# Patient Record
Sex: Female | Born: 1970 | Race: Black or African American | Hispanic: No | Marital: Single | Smoking: Never smoker
Health system: Southern US, Community
[De-identification: ages and names within clinical notes are randomized; demographics above are authoritative.]

## PROBLEM LIST (undated history)

## (undated) DIAGNOSIS — F419 Anxiety disorder, unspecified: Secondary | ICD-10-CM

## (undated) DIAGNOSIS — I10 Essential (primary) hypertension: Secondary | ICD-10-CM

## (undated) DIAGNOSIS — J45909 Unspecified asthma, uncomplicated: Secondary | ICD-10-CM

---

## 2000-11-08 ENCOUNTER — Encounter: Payer: Self-pay | Admitting: Emergency Medicine

## 2000-11-08 ENCOUNTER — Emergency Department (HOSPITAL_COMMUNITY): Admission: EM | Admit: 2000-11-08 | Discharge: 2000-11-09 | Payer: Self-pay | Admitting: Emergency Medicine

## 2002-08-28 ENCOUNTER — Emergency Department (HOSPITAL_COMMUNITY): Admission: EM | Admit: 2002-08-28 | Discharge: 2002-08-28 | Payer: Self-pay | Admitting: Emergency Medicine

## 2002-08-29 ENCOUNTER — Encounter: Payer: Self-pay | Admitting: Emergency Medicine

## 2002-10-03 ENCOUNTER — Encounter: Payer: Self-pay | Admitting: Emergency Medicine

## 2002-10-03 ENCOUNTER — Emergency Department (HOSPITAL_COMMUNITY): Admission: EM | Admit: 2002-10-03 | Discharge: 2002-10-03 | Payer: Self-pay | Admitting: Emergency Medicine

## 2003-03-16 ENCOUNTER — Emergency Department (HOSPITAL_COMMUNITY): Admission: EM | Admit: 2003-03-16 | Discharge: 2003-03-16 | Payer: Self-pay | Admitting: Emergency Medicine

## 2003-04-12 ENCOUNTER — Emergency Department (HOSPITAL_COMMUNITY): Admission: EM | Admit: 2003-04-12 | Discharge: 2003-04-12 | Payer: Self-pay | Admitting: Emergency Medicine

## 2003-07-21 ENCOUNTER — Emergency Department (HOSPITAL_COMMUNITY): Admission: EM | Admit: 2003-07-21 | Discharge: 2003-07-21 | Payer: Self-pay | Admitting: Emergency Medicine

## 2004-05-03 ENCOUNTER — Inpatient Hospital Stay (HOSPITAL_COMMUNITY): Admission: AD | Admit: 2004-05-03 | Discharge: 2004-05-03 | Payer: Self-pay | Admitting: Obstetrics

## 2005-12-29 ENCOUNTER — Emergency Department (HOSPITAL_COMMUNITY): Admission: EM | Admit: 2005-12-29 | Discharge: 2005-12-29 | Payer: Self-pay | Admitting: Emergency Medicine

## 2006-01-27 ENCOUNTER — Emergency Department (HOSPITAL_COMMUNITY): Admission: EM | Admit: 2006-01-27 | Discharge: 2006-01-27 | Payer: Self-pay | Admitting: Emergency Medicine

## 2007-04-14 ENCOUNTER — Emergency Department (HOSPITAL_COMMUNITY): Admission: EM | Admit: 2007-04-14 | Discharge: 2007-04-15 | Payer: Self-pay | Admitting: Emergency Medicine

## 2007-09-17 ENCOUNTER — Emergency Department (HOSPITAL_COMMUNITY): Admission: EM | Admit: 2007-09-17 | Discharge: 2007-09-17 | Payer: Self-pay | Admitting: Emergency Medicine

## 2009-10-20 ENCOUNTER — Emergency Department (HOSPITAL_COMMUNITY): Admission: EM | Admit: 2009-10-20 | Discharge: 2009-10-20 | Payer: Self-pay | Admitting: Emergency Medicine

## 2010-12-27 LAB — URINALYSIS, ROUTINE W REFLEX MICROSCOPIC
Glucose, UA: NEGATIVE
Ketones, ur: NEGATIVE
pH: 6

## 2010-12-27 LAB — URINE MICROSCOPIC-ADD ON

## 2010-12-27 LAB — URINE CULTURE

## 2011-01-24 ENCOUNTER — Emergency Department (HOSPITAL_COMMUNITY): Payer: No Typology Code available for payment source

## 2011-01-24 ENCOUNTER — Emergency Department (HOSPITAL_COMMUNITY)
Admission: EM | Admit: 2011-01-24 | Discharge: 2011-01-24 | Disposition: A | Discharge status: Home / Self Care | Payer: No Typology Code available for payment source | Attending: Emergency Medicine | Admitting: Emergency Medicine

## 2011-01-24 DIAGNOSIS — R51 Headache: Secondary | ICD-10-CM | POA: Insufficient documentation

## 2011-01-24 DIAGNOSIS — M542 Cervicalgia: Secondary | ICD-10-CM | POA: Insufficient documentation

## 2012-04-28 ENCOUNTER — Emergency Department (HOSPITAL_COMMUNITY)
Admission: EM | Admit: 2012-04-28 | Discharge: 2012-04-28 | Disposition: A | Discharge status: Home / Self Care | Payer: Self-pay | Attending: Emergency Medicine | Admitting: Emergency Medicine

## 2012-04-28 ENCOUNTER — Encounter (HOSPITAL_COMMUNITY): Payer: Self-pay | Admitting: *Deleted

## 2012-04-28 DIAGNOSIS — M79609 Pain in unspecified limb: Secondary | ICD-10-CM | POA: Insufficient documentation

## 2012-04-28 DIAGNOSIS — R6883 Chills (without fever): Secondary | ICD-10-CM | POA: Insufficient documentation

## 2012-04-28 DIAGNOSIS — M545 Low back pain, unspecified: Secondary | ICD-10-CM | POA: Insufficient documentation

## 2012-04-28 DIAGNOSIS — G8929 Other chronic pain: Secondary | ICD-10-CM | POA: Insufficient documentation

## 2012-04-28 DIAGNOSIS — M722 Plantar fascial fibromatosis: Secondary | ICD-10-CM | POA: Insufficient documentation

## 2012-04-28 DIAGNOSIS — M549 Dorsalgia, unspecified: Secondary | ICD-10-CM

## 2012-04-28 MED ORDER — METHOCARBAMOL 500 MG PO TABS: 500.0000 mg | ORAL_TABLET | Freq: Two times a day (BID) | ORAL | Status: DC

## 2012-04-28 MED ORDER — NAPROXEN 500 MG PO TABS: 500.0000 mg | ORAL_TABLET | Freq: Two times a day (BID) | ORAL | Status: DC
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
## 2012-04-28 NOTE — ED Provider Notes (Signed)
Medical screening examination/treatment/procedure(s) were performed by non-physician practitioner and as supervising physician I was immediately available for consultation/collaboration.   Avelardo Reesman, MD 04/28/12 2300 

## 2012-05-02 ENCOUNTER — Emergency Department (HOSPITAL_COMMUNITY)
Admission: EM | Admit: 2012-05-02 | Discharge: 2012-05-02 | Disposition: A | Discharge status: Home / Self Care | Payer: Self-pay | Attending: Emergency Medicine | Admitting: Emergency Medicine

## 2012-05-02 ENCOUNTER — Encounter (HOSPITAL_COMMUNITY): Payer: Self-pay | Admitting: *Deleted

## 2012-05-02 DIAGNOSIS — R110 Nausea: Secondary | ICD-10-CM | POA: Insufficient documentation

## 2012-05-02 DIAGNOSIS — G43909 Migraine, unspecified, not intractable, without status migrainosus: Secondary | ICD-10-CM | POA: Insufficient documentation

## 2012-05-02 DIAGNOSIS — Z79899 Other long term (current) drug therapy: Secondary | ICD-10-CM | POA: Insufficient documentation

## 2012-05-02 MED ORDER — HYDROCODONE-ACETAMINOPHEN 5-325 MG PO TABS: 1.0000 | ORAL_TABLET | ORAL | Status: DC | PRN

## 2012-05-02 MED ORDER — MORPHINE SULFATE 4 MG/ML IJ SOLN
6.0000 mg | Freq: Once | INTRAMUSCULAR | Status: AC
  Administered 2012-05-02: 6 mg via INTRAVENOUS
  Filled 2012-05-02: qty 2

## 2012-05-02 MED ORDER — METOCLOPRAMIDE HCL 5 MG/ML IJ SOLN
10.0000 mg | Freq: Once | INTRAMUSCULAR | Status: AC
  Administered 2012-05-02: 10 mg via INTRAVENOUS
  Filled 2012-05-02: qty 2

## 2012-05-02 MED ORDER — ONDANSETRON 8 MG PO TBDP: 8.0000 mg | ORAL_TABLET | Freq: Three times a day (TID) | ORAL | Status: DC | PRN

## 2012-05-02 MED ORDER — KETOROLAC TROMETHAMINE 30 MG/ML IJ SOLN
30.0000 mg | Freq: Once | INTRAMUSCULAR | Status: AC
  Administered 2012-05-02: 30 mg via INTRAVENOUS
  Filled 2012-05-02: qty 1

## 2012-05-02 MED ORDER — NAPROXEN 500 MG PO TABS: 500.0000 mg | ORAL_TABLET | Freq: Two times a day (BID) | ORAL | Status: DC

## 2012-05-02 NOTE — ED Notes (Signed)
Pt reports nausea and migraine x several days.  Reports intermittent pain.  Reports taking tylenol and ibuprofen with no relief.  Denies vomiting.  Reports last migraine was 3 months ago.

## 2012-05-02 NOTE — ED Provider Notes (Signed)
History     CSN: [id number]  Arrival date & time 05/02/12  1218   First MD Initiated Contact with Patient 05/02/12 1232      Chief Complaint  Patient presents with  . Migraine     The history is provided by the patient.   the patient reports worsening frontal headache over the past several days.  Her pain is been constant but at times gets worse.  She's tried ibuprofen and Tylenol without improvement in her symptoms.  No vomiting.  Only nausea.  She does have a history of migraine headaches and states this feels similar.  No use of anticoagulants.  No recent falls or trauma.  Her symptoms are moderate to severe in severity.  Her pain is worsened by noise and bright lights.  Nothing improves her symptoms.  She denies neck pain.  No fevers or chills.  No chest pain shortness of breath.  No abdominal pain.  Her headache was not acute in onset  Past medical history 1.  migraine headache  History reviewed. No pertinent past surgical history.  History reviewed. No pertinent family history.  History  Substance Use Topics  . Smoking status: Never Smoker   . Smokeless tobacco: Not on file  . Alcohol Use: No    OB History    Grav Para Term Preterm Abortions TAB SAB Ect Mult Living                  Review of Systems  All other systems reviewed and are negative.    Allergies  Review of patient's allergies indicates no known allergies.  Home Medications   Current Outpatient Rx  Name  Route  Sig  Dispense  Refill  . ACETAMINOPHEN 500 MG PO TABS   Oral   Take 1,000 mg by mouth every 6 (six) hours as needed. FOR PAIN         . ADULT MULTIVITAMIN W/MINERALS CH   Oral   Take 1 tablet by mouth daily.         Marland Kitchen HYDROCODONE-ACETAMINOPHEN 5-325 MG PO TABS   Oral   Take 1 tablet by mouth every 4 (four) hours as needed for pain.   15 tablet   0   . NAPROXEN 500 MG PO TABS   Oral   Take 1 tablet (500 mg total) by mouth 2 (two) times daily.   30 tablet   0     BP  144/93  Pulse 93  Temp 98.3 F (36.8 C) (Oral)  Resp 16  SpO2 100%  Physical Exam  Nursing note and vitals reviewed. Constitutional: She is oriented to person, place, and time. She appears well-developed and well-nourished. No distress.  HENT:  Head: Normocephalic and atraumatic.  Eyes: EOM are normal. Pupils are equal, round, and reactive to light.  Neck: Normal range of motion.  Cardiovascular: Normal rate, regular rhythm and normal heart sounds.   Pulmonary/Chest: Effort normal and breath sounds normal.  Abdominal: Soft. She exhibits no distension. There is no tenderness.  Musculoskeletal: Normal range of motion.  Neurological: She is alert and oriented to person, place, and time.       5/5 strength in major muscle groups of  bilateral upper and lower extremities. Speech normal. No facial asymetry.   Skin: Skin is warm and dry.  Psychiatric: She has a normal mood and affect. Judgment normal.    ED Course  Procedures (including critical care time)  Labs Reviewed - No data to display No results  found.   1. Migraine       MDM  Typical migraine headache for the pt. Non focal neuro exam. No recent head trauma. No fever. Doubt meningitis. Doubt intracranial bleed. Doubt normal pressure hydrocephalus. No indication for imaging. Will treat with migraine cocktail and reevaluate  2:11 PM Patient is feeling better at this time.  Discharge home in good condition        Lyanne Co, MD 05/02/12 [phone]

## 2013-03-21 ENCOUNTER — Encounter (HOSPITAL_COMMUNITY): Payer: Self-pay | Admitting: Emergency Medicine

## 2013-03-21 ENCOUNTER — Emergency Department (HOSPITAL_COMMUNITY)
Admission: EM | Admit: 2013-03-21 | Discharge: 2013-03-21 | Disposition: A | Discharge status: Home / Self Care | Payer: Medicaid Other | Attending: Emergency Medicine | Admitting: Emergency Medicine

## 2013-03-21 DIAGNOSIS — H6982 Other specified disorders of Eustachian tube, left ear: Secondary | ICD-10-CM

## 2013-03-21 DIAGNOSIS — H6980 Other specified disorders of Eustachian tube, unspecified ear: Secondary | ICD-10-CM | POA: Insufficient documentation

## 2013-03-21 DIAGNOSIS — H6123 Impacted cerumen, bilateral: Secondary | ICD-10-CM

## 2013-03-21 DIAGNOSIS — H6120 Impacted cerumen, unspecified ear: Secondary | ICD-10-CM | POA: Insufficient documentation

## 2013-03-21 DIAGNOSIS — H6990 Unspecified Eustachian tube disorder, unspecified ear: Secondary | ICD-10-CM | POA: Insufficient documentation

## 2013-03-21 DIAGNOSIS — Z791 Long term (current) use of non-steroidal anti-inflammatories (NSAID): Secondary | ICD-10-CM | POA: Insufficient documentation

## 2013-03-21 MED ORDER — PSEUDOEPHEDRINE HCL 60 MG PO TABS: 60.0000 mg | ORAL_TABLET | Freq: Three times a day (TID) | ORAL | Status: DC

## 2013-03-21 NOTE — ED Notes (Signed)
Both ears irrigated with large amount of was removal.  Pt st's she can hear much better now.

## 2013-03-21 NOTE — ED Notes (Signed)
Pt reports having left ear pain for a while and difficulty hearing out of his and it has now started in right ear also. No acute distress noted at triage.

## 2013-03-21 NOTE — ED Notes (Signed)
Pt st's she has been having trouble hearing out of her ears.  St's left ear started approx 1 week ago and now today she can't hear out of right ear.

## 2013-03-21 NOTE — ED Provider Notes (Signed)
CSN: [id number]     Arrival date & time 03/21/13  1622 History  This chart was scribed for non-physician practitione, Renne Crigler, PA-C  working with Flint Melter, MD, by Andrew Au, ED Scribe. This patient was seen in room TR06C/TR06C and the patient's care was started at 5:45 PM .  Chief Complaint  Patient presents with  . Otalgia   The history is provided by the patient. No language interpreter was used.   HPI Comments: Rhonda Rios is a 42 y.o. female who presents to the Emergency Department complaining of constant worsening, left ear pain onset one week ago. She is also complaining of mild right ear pain but pain is worse in the left ear. There is associated bilateral ear fullness and decreased hearing. Pt states she has not taken any medication. She reports that she is getting over a recent cold. She states that she saw her PCP who thought she has cerumen build up and advised her not to use q-tips. She denies any other symptoms at this time.    History reviewed. No pertinent past medical history. History reviewed. No pertinent past surgical history. History reviewed. No pertinent family history. History  Substance Use Topics  . Smoking status: Never Smoker   . Smokeless tobacco: Not on file  . Alcohol Use: No   OB History   Grav Para Term Preterm Abortions TAB SAB Ect Mult Living                 Review of Systems  Constitutional: Negative for fever, chills and fatigue.  HENT: Positive for ear pain. Negative for congestion, rhinorrhea, sinus pressure and sore throat.   Eyes: Negative for redness.  Respiratory: Negative for cough and wheezing.   Gastrointestinal: Negative for nausea, vomiting, abdominal pain and diarrhea.  Genitourinary: Negative for dysuria.  Musculoskeletal: Negative for myalgias and neck stiffness.  Skin: Negative for rash.  Neurological: Negative for dizziness and headaches.  Hematological: Negative for adenopathy.    Allergies  Review of  patient's allergies indicates no known allergies.  Home Medications   Current Outpatient Rx  Name  Route  Sig  Dispense  Refill  . acetaminophen (TYLENOL) 500 MG tablet   Oral   Take 1,000 mg by mouth every 6 (six) hours as needed. FOR PAIN         . HYDROcodone-acetaminophen (NORCO/VICODIN) 5-325 MG per tablet   Oral   Take 1 tablet by mouth every 4 (four) hours as needed for pain.   15 tablet   0   . Multiple Vitamin (MULTIVITAMIN WITH MINERALS) TABS   Oral   Take 1 tablet by mouth daily.         . naproxen (NAPROSYN) 500 MG tablet   Oral   Take 1 tablet (500 mg total) by mouth 2 (two) times daily.   30 tablet   0   . ondansetron (ZOFRAN ODT) 8 MG disintegrating tablet   Oral   Take 1 tablet (8 mg total) by mouth every 8 (eight) hours as needed for nausea.   12 tablet   0    Triage Vitals BP 126/86  Pulse 91  Temp(Src) 98.5 F (36.9 C) (Oral)  Resp 20  Wt 158 lb 2 oz (71.725 kg)  SpO2 96% Physical Exam  Nursing note and vitals reviewed. Constitutional: She appears well-developed and well-nourished. No distress.  HENT:  Head: Normocephalic and atraumatic.  Right Ear: Tympanic membrane, external ear and ear canal normal.  Left Ear:  External ear and ear canal normal. Tympanic membrane is bulging. Tympanic membrane is not erythematous. A middle ear effusion is present.  Nose: Nose normal. No mucosal edema or rhinorrhea.  Mouth/Throat: Uvula is midline, oropharynx is clear and moist and mucous membranes are normal. Mucous membranes are not dry. No oral lesions. No trismus in the jaw. No uvula swelling. No oropharyngeal exudate, posterior oropharyngeal edema, posterior oropharyngeal erythema or tonsillar abscesses.  Cerumen impaction in both ears. After irrigation: right TM normal. Left TM bulging. Left middle ear effusion.   Eyes: Conjunctivae and EOM are normal. Right eye exhibits no discharge. Left eye exhibits no discharge.  Neck: Normal range of motion. Neck  supple. No tracheal deviation present.  Cardiovascular: Normal rate, regular rhythm and normal heart sounds.   Pulmonary/Chest: Effort normal and breath sounds normal. No respiratory distress. She has no wheezes. She has no rales.  Abdominal: Soft. There is no tenderness.  Musculoskeletal: Normal range of motion.  Lymphadenopathy:    She has no cervical adenopathy.  Neurological: She is alert.  Skin: Skin is warm and dry.  Psychiatric: She has a normal mood and affect. Her behavior is normal.    ED Course  Procedures DIAGNOSTIC STUDIES: Oxygen Saturation is 96% on RA, normal by my interpretation.    COORDINATION OF CARE: 5:45 PM- Pt advised of plan for treatment and pt agrees.     Labs Review Labs Reviewed - No data to display Imaging Review No results found.  EKG Interpretation   None      Vital signs reviewed and are as follows: Filed Vitals:   03/21/13 1630  BP: 126/86  Pulse: 91  Temp: 98.5 F (36.9 C)  Resp: 20   Irrigation performed by nurse. Large amount of cerumen removed.   Exam performed. Patient still has slight muffled hearing in L ear. Counseled on use of decongestants. ENT f/u prn.   Patient urged to return with worsening symptoms or other concerns. Patient verbalized understanding and agrees with plan.   MDM   1. Cerumen impaction, bilateral   2. Eustachian tube dysfunction, left    Suspect eustachian tube dysfunction due to recent viral illness, middle ear effusion without erythema. Pain may be exacerbated by impaction now cleared. No rupture of TM.   I personally performed the services described in this documentation, which was scribed in my presence. The recorded information has been reviewed and is accurate.    Renne Crigler, PA-C 03/21/13 2354

## 2013-03-22 NOTE — ED Provider Notes (Signed)
Medical screening examination/treatment/procedure(s) were performed by non-physician practitioner and as supervising physician I was immediately available for consultation/collaboration.  EKG Interpretation   None        Flint Melter, MD 03/22/13 1153

## 2013-05-22 ENCOUNTER — Emergency Department (HOSPITAL_COMMUNITY)
Admission: EM | Admit: 2013-05-22 | Discharge: 2013-05-22 | Disposition: A | Discharge status: Home / Self Care | Payer: Medicaid Other | Attending: Emergency Medicine | Admitting: Emergency Medicine

## 2013-05-22 ENCOUNTER — Encounter (HOSPITAL_COMMUNITY): Payer: Self-pay | Admitting: Emergency Medicine

## 2013-05-22 DIAGNOSIS — G8929 Other chronic pain: Secondary | ICD-10-CM | POA: Insufficient documentation

## 2013-05-22 DIAGNOSIS — Z87828 Personal history of other (healed) physical injury and trauma: Secondary | ICD-10-CM | POA: Insufficient documentation

## 2013-05-22 DIAGNOSIS — M545 Low back pain, unspecified: Secondary | ICD-10-CM | POA: Insufficient documentation

## 2013-05-22 MED ORDER — HYDROCODONE-ACETAMINOPHEN 5-325 MG PO TABS: 1.0000 | ORAL_TABLET | ORAL | Status: DC | PRN

## 2013-05-22 MED ORDER — CYCLOBENZAPRINE HCL 10 MG PO TABS: 10.0000 mg | ORAL_TABLET | Freq: Three times a day (TID) | ORAL | Status: DC | PRN

## 2013-05-22 NOTE — ED Notes (Signed)
Pt informed of need for urine sample, reports she just urinated 10 mins before coming back to room.

## 2013-05-22 NOTE — Discharge Instructions (Signed)
Back Pain, Adult Low back pain is very common. About 1 in 5 people have back pain.The cause of low back pain is rarely dangerous. The pain often gets better over time.About half of people with a sudden onset of back pain feel better in just 2 weeks. About 8 in 10 people feel better by 6 weeks.  CAUSES Some common causes of back pain include:  Strain of the muscles or ligaments supporting the spine.  Wear and tear (degeneration) of the spinal discs.  Arthritis.  Direct injury to the back. DIAGNOSIS Most of the time, the direct cause of low back pain is not known.However, back pain can be treated effectively even when the exact cause of the pain is unknown.Answering your caregiver's questions about your overall health and symptoms is one of the most accurate ways to make sure the cause of your pain is not dangerous. If your caregiver needs more information, he or she may order lab work or imaging tests (X-rays or MRIs).However, even if imaging tests show changes in your back, this usually does not require surgery. HOME CARE INSTRUCTIONS For many people, back pain returns.Since low back pain is rarely dangerous, it is often a condition that people can learn to manageon their own.   Remain active. It is stressful on the back to sit or stand in one place. Do not sit, drive, or stand in one place for more than 30 minutes at a time. Take short walks on level surfaces as soon as pain allows.Try to increase the length of time you walk each day.  Do not stay in bed.Resting more than 1 or 2 days can delay your recovery.  Do not avoid exercise or work.Your body is made to move.It is not dangerous to be active, even though your back may hurt.Your back will likely heal faster if you return to being active before your pain is gone.  Pay attention to your body when you bend and lift. Many people have less discomfortwhen lifting if they bend their knees, keep the load close to their bodies,and  avoid twisting. Often, the most comfortable positions are those that put less stress on your recovering back.  Find a comfortable position to sleep. Use a firm mattress and lie on your side with your knees slightly bent. If you lie on your back, put a pillow under your knees.  Only take over-the-counter or prescription medicines as directed by your caregiver. Over-the-counter medicines to reduce pain and inflammation are often the most helpful.Your caregiver may prescribe muscle relaxant drugs.These medicines help dull your pain so you can more quickly return to your normal activities and healthy exercise.  Put ice on the injured area.  Put ice in a plastic bag.  Place a towel between your skin and the bag.  Leave the ice on for 15-20 minutes, 03-04 times a day for the first 2 to 3 days. After that, ice and heat may be alternated to reduce pain and spasms.  Ask your caregiver about trying back exercises and gentle massage. This may be of some benefit.  Avoid feeling anxious or stressed.Stress increases muscle tension and can worsen back pain.It is important to recognize when you are anxious or stressed and learn ways to manage it.Exercise is a great option. SEEK MEDICAL CARE IF:  You have pain that is not relieved with rest or medicine.  You have pain that does not improve in 1 week.  You have new symptoms.  You are generally not feeling well. SEEK   IMMEDIATE MEDICAL CARE IF:   You have pain that radiates from your back into your legs.  You develop new bowel or bladder control problems.  You have unusual weakness or numbness in your arms or legs.  You develop nausea or vomiting.  You develop abdominal pain.  You feel faint. Document Released: 03/26/2005 Document Revised: 09/25/2011 Document Reviewed: 08/14/2010 [employer] Patient Information 2014 [employer].  

## 2013-05-22 NOTE — ED Notes (Addendum)
Pt reports intermittent lower back pain x4-5 weeks, Right calf pain x4 days, and abnormal vaginal odor and discharge x1 week. Pt denies burning with urination. Pt states she has been trying to get pregnant and is unsure if she is pregnant. Pt's LNM 04/05/2013
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
## 2013-05-23 NOTE — ED Provider Notes (Signed)
I saw and evaluated the patient, reviewed the resident's note and I agree with the findings and plan.  EKG Interpretation   None       Pt with gradual onset of back pain suggestive of ddd.  No neurovascular compromise and no incontinence.  Pt has no infectious sx, hx of CA  or other red flags concerning for pathologic back pain.  Pt is able to ambulate but is painful.  Normal strength and reflexes on exam.  Denies trauma. Will give pt pain control and to return for developement of above sx.  Denies any urinary sx.   Gwyneth SproutWhitney Akeila Lana, MD 05/23/13 [phone]

## 2013-09-14 ENCOUNTER — Emergency Department (HOSPITAL_COMMUNITY)
Admission: EM | Admit: 2013-09-14 | Discharge: 2013-09-14 | Disposition: A | Discharge status: Home / Self Care | Payer: Medicaid Other | Attending: Emergency Medicine | Admitting: Emergency Medicine

## 2013-09-14 ENCOUNTER — Encounter (HOSPITAL_COMMUNITY): Payer: Self-pay | Admitting: Emergency Medicine

## 2013-09-14 DIAGNOSIS — M545 Low back pain, unspecified: Secondary | ICD-10-CM

## 2013-09-14 DIAGNOSIS — G43909 Migraine, unspecified, not intractable, without status migrainosus: Secondary | ICD-10-CM

## 2013-09-14 DIAGNOSIS — Z79899 Other long term (current) drug therapy: Secondary | ICD-10-CM | POA: Insufficient documentation

## 2013-09-14 DIAGNOSIS — B9689 Other specified bacterial agents as the cause of diseases classified elsewhere: Secondary | ICD-10-CM

## 2013-09-14 DIAGNOSIS — Z792 Long term (current) use of antibiotics: Secondary | ICD-10-CM | POA: Insufficient documentation

## 2013-09-14 DIAGNOSIS — N760 Acute vaginitis: Secondary | ICD-10-CM

## 2013-09-14 LAB — URINE MICROSCOPIC-ADD ON

## 2013-09-14 LAB — URINALYSIS, ROUTINE W REFLEX MICROSCOPIC
Bilirubin Urine: NEGATIVE
GLUCOSE, UA: NEGATIVE mg/dL
HGB URINE DIPSTICK: NEGATIVE
KETONES UR: NEGATIVE mg/dL
Nitrite: NEGATIVE
PROTEIN: NEGATIVE mg/dL
Specific Gravity, Urine: 1.013 (ref 1.005–1.030)
UROBILINOGEN UA: 0.2 mg/dL (ref 0.0–1.0)
pH: 6.5 (ref 5.0–8.0)

## 2013-09-14 LAB — WET PREP, GENITAL
TRICH WET PREP: NONE SEEN
YEAST WET PREP: NONE SEEN

## 2013-09-14 MED ORDER — METOCLOPRAMIDE HCL 5 MG/ML IJ SOLN
10.0000 mg | Freq: Once | INTRAMUSCULAR | Status: AC
  Administered 2013-09-14: 10 mg via INTRAMUSCULAR
  Filled 2013-09-14: qty 2

## 2013-09-14 MED ORDER — METHOCARBAMOL 500 MG PO TABS: 500.0000 mg | ORAL_TABLET | Freq: Two times a day (BID) | ORAL | Status: DC

## 2013-09-14 MED ORDER — DIPHENHYDRAMINE HCL 12.5 MG/5ML PO ELIX
12.5000 mg | ORAL_SOLUTION | ORAL | Status: AC
  Administered 2013-09-14: 12.5 mg via ORAL
  Filled 2013-09-14: qty 10

## 2013-09-14 MED ORDER — METHOCARBAMOL 500 MG PO TABS
500.0000 mg | ORAL_TABLET | Freq: Once | ORAL | Status: AC
  Administered 2013-09-14: 500 mg via ORAL
  Filled 2013-09-14: qty 1

## 2013-09-14 MED ORDER — METRONIDAZOLE 500 MG PO TABS: 500.0000 mg | ORAL_TABLET | Freq: Two times a day (BID) | ORAL | Status: DC

## 2013-09-14 MED ORDER — DIPHENHYDRAMINE HCL 50 MG/ML IJ SOLN
12.5000 mg | Freq: Once | INTRAMUSCULAR | Status: DC
  Filled 2013-09-14: qty 1

## 2013-09-14 MED ORDER — DEXAMETHASONE SODIUM PHOSPHATE 10 MG/ML IJ SOLN
10.0000 mg | Freq: Once | INTRAMUSCULAR | Status: AC
  Administered 2013-09-14: 10 mg via INTRAMUSCULAR
  Filled 2013-09-14: qty 1

## 2013-09-14 MED ORDER — KETOROLAC TROMETHAMINE 60 MG/2ML IM SOLN
60.0000 mg | Freq: Once | INTRAMUSCULAR | Status: AC
  Administered 2013-09-14: 60 mg via INTRAMUSCULAR
  Filled 2013-09-14: qty 2
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
## 2013-09-14 NOTE — ED Notes (Addendum)
The patient said she started having back pain since 2003.  She also says she has mirgraines and she has had one three days ago and it has not gone away after taking tylenol and ibuprofen.  The patient also said her partner just told her he was treated for trichomonas.  She wants to get checked and get treated for the trichomonas.  She rates her pain level in her head 7/10, and 8/10 for the lower back.  Patient does say she has a yellow discharge and her urine in "bright yellow" and does have an certain odor.

## 2013-09-14 NOTE — ED Notes (Signed)
Pt given one bus pass. A&Ox4, ambulatory at d/c with steady gait, NAD.

## 2013-09-14 NOTE — ED Notes (Signed)
Pt states she may have a vaginal STD. Pt denies any itching, burning, foul smells, or discharge. Pt LMP was 3 weeks ago.

## 2013-09-14 NOTE — Discharge Instructions (Signed)
1. Medications:  Flagyl, Robaxin, usual home medications 2. Treatment: rest, drink plenty of fluids, use safe sex techniques, ice and she for your back, back exercises as discussed 3. Follow Up: Please followup with your primary doctor and OB/GYN for discussion of your diagnoses and further evaluation after today's visit; if you do not have a primary care doctor use the resource guide provided to find one;    Back Exercises Back exercises help treat and prevent back injuries. The goal of back exercises is to increase the strength of your abdominal and back muscles and the flexibility of your back. These exercises should be started when you no longer have back pain. Back exercises include:  Pelvic Tilt. Lie on your back with your knees bent. Tilt your pelvis until the lower part of your back is against the floor. Hold this position 5 to 10 sec and repeat 5 to 10 times.  Knee to Chest. Pull first 1 knee up against your chest and hold for 20 to 30 seconds, repeat this with the other knee, and then both knees. This may be done with the other leg straight or bent, whichever feels better.  Sit-Ups or Curl-Ups. Bend your knees 90 degrees. Start with tilting your pelvis, and do a partial, slow sit-up, lifting your trunk only 30 to 45 degrees off the floor. Take at least 2 to 3 seconds for each sit-up. Do not do sit-ups with your knees out straight. If partial sit-ups are difficult, simply do the above but with only tightening your abdominal muscles and holding it as directed.  Hip-Lift. Lie on your back with your knees flexed 90 degrees. Push down with your feet and shoulders as you raise your hips a couple inches off the floor; hold for 10 seconds, repeat 5 to 10 times.  Back arches. Lie on your stomach, propping yourself up on bent elbows. Slowly press on your hands, causing an arch in your low back. Repeat 3 to 5 times. Any initial stiffness and discomfort should lessen with repetition over  time.  Shoulder-Lifts. Lie face down with arms beside your body. Keep hips and torso pressed to floor as you slowly lift your head and shoulders off the floor. Do not overdo your exercises, especially in the beginning. Exercises may cause you some mild back discomfort which lasts for a few minutes; however, if the pain is more severe, or lasts for more than 15 minutes, do not continue exercises until you see your caregiver. Improvement with exercise therapy for back problems is slow.  See your caregivers for assistance with developing a proper back exercise program. Document Released: 05/03/2004 Document Revised: 06/18/2011 Document Reviewed: 01/25/2011 [facility] Patient Information 2014 [place], [place].     Emergency Department Resource Guide 1) Find a Doctor and Pay Out of Pocket Although you won't have to find out who is covered by your insurance plan, it is a good idea to ask around and get recommendations. You will then need to call the office and see if the doctor you have chosen will accept you as a new patient and what types of options they offer for patients who are self-pay. Some doctors offer discounts or will set up payment plans for their patients who do not have insurance, but you will need to ask so you aren't surprised when you get to your appointment.  2) Contact Your Local Health Department Not all health departments have doctors that can see patients for sick visits, but many do, so it is worth  a call to see if yours does. If you don't know where your local health department is, you can check in your phone book. The CDC also has a tool to help you locate your state's health department, and many state websites also have listings of all of their local health departments.  3) Find a Walk-in Clinic If your illness is not likely to be very severe or complicated, you may want to try a walk in clinic. These are popping up all over the country in pharmacies, drugstores, and shopping  centers. They're usually staffed by nurse practitioners or physician assistants that have been trained to treat common illnesses and complaints. They're usually fairly quick and inexpensive. However, if you have serious medical issues or chronic medical problems, these are probably not your best option.  No Primary Care Doctor: - Call Health Connect at  [phone] - they can help you locate a primary care doctor that  accepts your insurance, provides certain services, etc. - Physician Referral Service- [phone]  Chronic Pain Problems: Organization         Address  Phone   Notes  [facility]  [phone] Patients need to be referred by their primary care doctor.   Medication Assistance: Organization         Address  Phone   Notes  [facility]ication [facility] [address] [place], [place] [address] [phone] --Must be a resident of [place] [facility] [place] -- Must have NO insurance coverage whatsoever (no Medicaid/ Medicare, etc.) -- The pt. MUST have a primary care doctor that directs their care regularly and follows them in the community   Med[facility]ist  [phone]   [employer]  [phone]    Agencies that provide inexpensive medical care: Organization         Address  Phone   Notes  [facility]  [phone]   [facility]    [phone]   [facility] [address] [place], [place] [address] [phone]   [facility] [address], [place] [phone]   Planned Parenthood    [phone]   [facility]    [phone]   [facility]  [address], [place] Phone:  [phone], Fax:  [phone] Hours of Operation:  9 am - 6 pm, M-F.  Also accepts Medicaid/Medicare and self-pay.  [facility]  [address], [place] Phone: [phone], Fax: [phone]  [phone]. Hours of Operation:  8:30 am - 5:30 pm, M-F.  Also accepts Medicaid and self-pay.  [facility] [place] [address], [place] Phone: [phone]   [facility] [address], [place], [place] [phone], Ext. [phone] Mondays & Thursdays: 7-9 AM.  First 15 patients are seen on a first come, first serve basis.    Medicaid-accepting [facility] Providers:  Organization         Address  Phone   Notes  [facility] [address], [place] [phone] Also accepts self-pay patients.  [facility] [address], [place]  [phone]   [facility] [address], [place] [phone]   [facility] [address] [place] [address], [place] [phone]   Renaye RakersVeita Bland [address], [place]  [address], [place]   [phone] Only accepts [place] patients after they have their name applied to their card.   Self-Pay (no insurance) in [facility], [facility]:  Organization         Address  Phone   Notes  Sickle Cell Patients, [facility] [address] [place], [place] [phone]   [facility] [address] [place], [place] [phone]   [facility]  [address], [place] [phone]   [facility]/Dr. Osei-Bonsu  [address] [place] or [address], [place] [phone] Phone number for both [place] and [place] locations is the same.  [facility]gent Medical and [facility] [address] [place] [phone]   [facility] [address] [place] or [address] [phone]   [facility] [address], [place] [phone], phone; [phone], fax Sees patients 1st and 3rd Saturday of every month.  Must not qualify for public or private insurance (i.e.  Medicaid, Medicare, [place] Health Choice, Veterans' Benefits)  Household income should be no more than 200% of the poverty level The clinic cannot treat you if you are pregnant or think you are pregnant  Sexually transmitted diseases are not treated at the clinic.    Dental Care: Organization         Address  Phone  Notes  [facility] of [facility] [address] [place], [place] [phone] Accepts children up to age 29 who are enrolled in [place] or [place] Health Choice; pregnant women with a Medicaid card; and children who have applied for Medicaid or [place] Health Choice, but were declined, whose parents can pay a reduced fee at time of service.  [facility]  [address], [place] [phone] Accepts children up to age 79 who are enrolled in [place] or [place] Health Choice; pregnant women with a Medicaid card; and children who have applied for Medicaid or [place] Health Choice, but were declined, whose parents can pay a reduced fee at time of service.  [facility]  [address] [place], [place] [phone] Patients are seen by appointment only. Walk-ins are not accepted. [facility] will see patients 70 years of age and older. Monday - Tuesday (8am-5pm) Most Wednesdays (8:30-5pm) $30 per visit, cash only  [facility] Access [facility]  [address], [place] [facility] [phone] Patients are seen by appointment only. Walk-ins are not accepted. [facility] will see patients 46 years of age and older. One Wednesday Evening (Monthly: Volunteer Based).  $30 per visit, cash only  [employer]  [phone] for adults; Children under age 25, call Graduate Pediatric Dentistry at [phone]. Children aged 66-14, please call [phone] to request a pediatric application.  Dental services are provided in all areas of dental care including fillings,  crowns and bridges, complete and partial dentures, implants, gum treatment, root canals, and extractions. Preventive care is also provided. Treatment is provided to both adults and children. Patients are selected via a lottery and there is often a waiting list.   [facility] [address], [place]  [phone] [email or web address]   [facility] [address] [place], [place] [phone], Ext. [phone] Second and Fourth Thursday of each  month, opens at 6:30 AM; Clinic ends at 9 AM.  Patients are seen on a first-come first-served basis, and a limited number are seen during each clinic.   [facility]  [address] [place], [place] [phone]   Eligibility Requirements You must have lived in [place], [place], or [place] counties for at least the last three months.   You cannot be eligible for state or federal sponsored [place], including [employer], [place], or [employer].   You generally cannot be eligible for healthcare insurance through your employer.    How to apply: Eligibility screenings are held every Tuesday and Wednesday afternoon from 1:00 pm until 4:00 pm. You do not need an appointment for the interview!  [facility] [address] [place], [place] [phone]   [facility] Health Department  [phone]   [facility] Health Department  [phone]   [facility] Health Department  [phone]    Behavioral Health Resources in the Community: Intensive Outpatient Programs Organization         Address  Phone  Notes  [facility] Services [address] [place], [place] [phone]   [facility] Outpatient [address] [place], [place] [phone]   [facility] [address] [place], [place]  [phone]   [facility] [address],  [place], [place] [phone] or [phone]   Substance Abuse  Resources Organization         Address  Phone  Notes  [facility] Drug Services  [phone]   [facility]  [phone]   [facility]  [phone]   Floydene Flock  [phone]   Residential & Outpatient Substance Abuse Program  [phone]   Psychological Services Organization         Address  Phone  Notes  [facility]  [phone]   [facility] Services  [phone]   [facility] Mental Health [address], [place] [phone] or [phone]    Mobile Crisis Teams Organization         Address  Phone  Notes  [facility], Mobile Crisis Care Unit  [phone]   [facility]  [address] [place], [place] [phone]   Doristine Locks [address] [place] [phone]    Self-Help/Support Groups Organization         Address  Phone             Notes  [facility] [place] - variety of support groups  [phone]- [phone] Call for more information  Narcotics Anonymous (NA), [facility] [address], [employer] [place]  2 meetings at this location   Statistician         Address  Phone  Notes  [facility] [address],    [place]  [phone]   [facility]  [address] [place] [address], [place], [place] [phone]   [facility] [address] [place], [place] [phone] Admissions: 8am-3pm M-F  [facility] [address],    [place], [place] [phone]   [facility] [address] [place]ling Mann[place], [place] [phone]   [facility] [address]  [place], [place] [phone]   [facility] - Intensive Outpatient [address], [place], [place] [phone]   [facility] ([facility] [address],  [place], [place]  [phone] or [phone]   [facility] ([facility]) [address] [place], [place] [phone] Accepts Medicaid  [facility] [address]  [place] [phone] Substance Abuse/Addiction Treatment   [facility] Organization         Address  Phone  Notes  [facility]  [phone]   Angie Fava, PhD [address] Ervin Knack [place], [place]   [phone] or [phone]   [facility] Behavioral   [address] [place], [place] [phone]   [facility] [address] [place], [place] [phone] Insurance/Medicaid/sponsorship through [facility] and Families [address]                                    [place], [place] [phone] Therapy/tele-psych/case  [facility] [address], [place] [phone]    Dr. Lolly Mustache  [phone]   [facility]  [employer] [facility] [place] Dept. 1) [address] [place] 2) [address], [place] 3)  [address], [place] [phone]  [phone]   [facility] Child Abuse Hotline (707)218-4932 or [phone] (After Hours)

## 2013-09-15 LAB — GC/CHLAMYDIA PROBE AMP
CT PROBE, AMP APTIMA: POSITIVE — AB
GC Probe RNA: NEGATIVE

## 2013-09-17 NOTE — ED Provider Notes (Signed)
Medical screening examination/treatment/procedure(s) were performed by non-physician practitioner and as supervising physician I was immediately available for consultation/collaboration.   EKG Interpretation None        Vanetta Mulders, MD 09/17/13 1117

## 2013-09-19 ENCOUNTER — Telehealth (HOSPITAL_BASED_OUTPATIENT_CLINIC_OR_DEPARTMENT_OTHER): Payer: Self-pay | Admitting: Emergency Medicine

## 2013-09-19 ENCOUNTER — Encounter (HOSPITAL_COMMUNITY): Payer: Self-pay | Admitting: Emergency Medicine

## 2013-09-19 ENCOUNTER — Emergency Department (HOSPITAL_COMMUNITY)
Admission: EM | Admit: 2013-09-19 | Discharge: 2013-09-19 | Disposition: A | Discharge status: Home / Self Care | Payer: Medicaid Other | Attending: Emergency Medicine | Admitting: Emergency Medicine

## 2013-09-19 DIAGNOSIS — Z8679 Personal history of other diseases of the circulatory system: Secondary | ICD-10-CM | POA: Insufficient documentation

## 2013-09-19 DIAGNOSIS — R51 Headache: Secondary | ICD-10-CM | POA: Insufficient documentation

## 2013-09-19 DIAGNOSIS — R519 Headache, unspecified: Secondary | ICD-10-CM

## 2013-09-19 MED ORDER — KETOROLAC TROMETHAMINE 60 MG/2ML IM SOLN
60.0000 mg | Freq: Once | INTRAMUSCULAR | Status: AC
  Administered 2013-09-19: 60 mg via INTRAMUSCULAR
  Filled 2013-09-19: qty 2

## 2013-09-19 MED ORDER — DIPHENHYDRAMINE HCL 50 MG/ML IJ SOLN: 25.0000 mg | Freq: Once | INTRAMUSCULAR | Status: DC

## 2013-09-19 MED ORDER — METOCLOPRAMIDE HCL 5 MG/ML IJ SOLN
10.0000 mg | Freq: Once | INTRAMUSCULAR | Status: AC
  Administered 2013-09-19: 10 mg via INTRAMUSCULAR
  Filled 2013-09-19: qty 2

## 2013-09-19 MED ORDER — DEXAMETHASONE SODIUM PHOSPHATE 10 MG/ML IJ SOLN
10.0000 mg | Freq: Once | INTRAMUSCULAR | Status: AC
  Administered 2013-09-19: 10 mg via INTRAMUSCULAR
  Filled 2013-09-19: qty 1

## 2013-09-19 MED ORDER — ASPIRIN-ACETAMINOPHEN-CAFFEINE 250-250-65 MG PO TABS: 2.0000 | ORAL_TABLET | Freq: Four times a day (QID) | ORAL | Status: DC | PRN

## 2013-09-19 MED ORDER — DIPHENHYDRAMINE HCL 25 MG PO CAPS
25.0000 mg | ORAL_CAPSULE | Freq: Once | ORAL | Status: AC
  Administered 2013-09-19: 25 mg via ORAL
  Filled 2013-09-19: qty 1

## 2013-09-19 NOTE — Telephone Encounter (Signed)
+  Chlamydia. Chart sent to EDP office for review. DHHS attached. 

## 2013-09-19 NOTE — ED Notes (Signed)
Patient complains of a throbbing headache for four days. Patient denies any visual changes, nausea, vomiting, Diarrhea, numbness, or tingling.

## 2013-09-19 NOTE — ED Notes (Signed)
Pt. Stated, I was here 4 days ago for the same headache and what they gave me is not working.  Im still having a bad headache.

## 2013-09-19 NOTE — ED Provider Notes (Signed)
CSN: [id number]     Arrival date & time 09/19/13  1314 History  This chart was [id number] for non-physician practitioner, Junius FinnerErin O'Malley, PA-C,working with Audree CamelScott T Goldston, MD, by Karle PlumberJennifer Tensley, ED Scribe.  This patient was seen in room TR10C/TR10C and the patient's care was started at 2:44 PM.  Chief Complaint  Patient presents with  . Headache   The history is provided by the patient. No language interpreter was used.   HPI Comments:  Rhonda Rios is a 43 y.o. female with h/o migraines who presents to the Emergency Department complaining of a severe throbbing HA that started four days ago. She states she was here four days ago and was treated with Reglan, Benadryl, Toradol, Decadron, and Robaxin prior to discharge. Pt states the pain has worsened since her visit and states she is still having a HA. She states this pain is similar to her other headaches. Pt reports getting 2-3 headaches per week. She usually takes Ibuprofen or Tylenol for the pain but states it is not helping now. She denies numbness or tingling, fever, nausea, or vomiting. She denies allergies to any medication. She reports family h/o migraines.  History reviewed. No pertinent past medical history. History reviewed. No pertinent past surgical history. No family history on file. History  Substance Use Topics  . Smoking status: Never Smoker   . Smokeless tobacco: Not on file  . Alcohol Use: No   OB History   Grav Para Term Preterm Abortions TAB SAB Ect Mult Living                 Review of Systems  Constitutional: Negative for fever.  Gastrointestinal: Negative for nausea and vomiting.  Neurological: Positive for headaches. Negative for numbness.  All other systems reviewed and are negative.   Allergies  Review of patient's allergies indicates no known allergies.  Home Medications   Prior to Admission medications   Medication Sig Start Date End Date Taking? Authorizing Provider  acetaminophen (TYLENOL)  325 MG tablet Take 650 mg by mouth every 6 (six) hours as needed for mild pain.    Yes Historical Provider, MD  ibuprofen (ADVIL,MOTRIN) 200 MG tablet Take 400 mg by mouth every 6 (six) hours as needed for moderate pain.    Yes Historical Provider, MD  metroNIDAZOLE (FLAGYL) 500 MG tablet Take 1 tablet (500 mg total) by mouth 2 (two) times daily. One po bid x 7 days 09/14/13  Yes Hannah Muthersbaugh, PA-C  aspirin-acetaminophen-caffeine (EXCEDRIN MIGRAINE) 610-071-8555250-250-65 MG per tablet Take 2 tablets by mouth every 6 (six) hours as needed for headache. 09/19/13   Junius FinnerErin O'Malley, PA-C   Triage Vitals: BP 144/86  Pulse 79  Temp(Src) 98.4 F (36.9 C) (Oral)  Resp 17  Wt 154 lb (69.854 kg)  SpO2 100%  LMP 08/31/2013 Physical Exam  Nursing note and vitals reviewed. Constitutional: She is oriented to person, place, and time. She appears well-developed and well-nourished.  Pt sitting in darkened exam room, NAD  HENT:  Head: Normocephalic and atraumatic.  Eyes: Conjunctivae and EOM are normal. Pupils are equal, round, and reactive to light.  Neck: Normal range of motion. Neck supple.  No nuchal rigidity or meningeal signs.   Cardiovascular: Normal rate, regular rhythm and normal heart sounds.  Exam reveals no gallop and no friction rub.   No murmur heard. Pulmonary/Chest: Effort normal and breath sounds normal. No respiratory distress. She has no wheezes. She has no rales.  Abdominal: Soft. She exhibits no distension. There  is no tenderness.  Musculoskeletal: Normal range of motion.  Neurological: She is alert and oriented to person, place, and time. She has normal strength. No sensory deficit. She displays a negative Romberg sign. Gait normal. GCS eye subscore is 4. GCS verbal subscore is 5. GCS motor subscore is 6.  5/5 strength of UE. Normal gait.  Skin: Skin is warm and dry.  Psychiatric: She has a normal mood and affect. Her behavior is normal.    ED Course  Procedures (including critical care  time) DIAGNOSTIC STUDIES: Oxygen Saturation is 100% on RA, normal by my interpretation.   COORDINATION OF CARE: 2:48 PM- Will give Regan, Benadryl, Decadron and Toradol. Pt verbalizes understanding and agrees to plan.  Medications  metoCLOPramide (REGLAN) injection 10 mg (10 mg Intramuscular Given 09/19/13 1501)  ketorolac (TORADOL) injection 60 mg (60 mg Intramuscular Given 09/19/13 1500)  dexamethasone (DECADRON) injection 10 mg (10 mg Intramuscular Given 09/19/13 1506)  diphenhydrAMINE (BENADRYL) capsule 25 mg (25 mg Oral Given 09/19/13 1459)    Labs Review Labs Reviewed - No data to display  Imaging Review No results found.   EKG Interpretation None      MDM   Final diagnoses:  Headache  Frequent headaches    Pt presenting to ED c/o headache that was gradual in onset, feels similar to previous headaches. Denies change in vision, numbness, weakness, neck pain, fever, n/v/d.  Denies head injuries. Not concerned for [facility], CVA, meningitis, or temporal arteritis.  States medication given in ED for headache did help last time she was in ED for headache but states home medication did not help.  Tx in ED: reglan, toradol, decadron, and benadryl. Pt able to keep down several ounces of PO fluids. States her headache has improved and she feels comfortable being discharged home. Return precautions provided. Pt verbalized understanding and agreement with tx plan.   I personally performed the services described in this documentation, which was scribed in my presence. The recorded information has been reviewed and is accurate.    Junius Finnerrin O'Malley, PA-C 09/19/13 1630

## 2013-09-19 NOTE — ED Notes (Signed)
PA at the bedside.

## 2013-09-20 NOTE — ED Provider Notes (Signed)
Medical screening examination/treatment/procedure(s) were performed by non-physician practitioner and as supervising physician I was immediately available for consultation/collaboration.   EKG Interpretation None        Audree CamelScott T Aryianna Earwood, MD 09/20/13 [phone]

## 2013-09-27 ENCOUNTER — Telehealth (HOSPITAL_BASED_OUTPATIENT_CLINIC_OR_DEPARTMENT_OTHER): Payer: Self-pay

## 2013-09-27 NOTE — Telephone Encounter (Signed)
6/21 @2053  LVM requesting callback.  6/21 @ 2105 Pt called back.  Pt hung up when I asked her the last 4 of SS#. [facility].

## 2013-10-01 ENCOUNTER — Telehealth (HOSPITAL_COMMUNITY): Payer: Self-pay

## 2013-12-25 ENCOUNTER — Encounter (HOSPITAL_COMMUNITY): Payer: Self-pay | Admitting: Emergency Medicine

## 2013-12-25 ENCOUNTER — Emergency Department (HOSPITAL_COMMUNITY)
Admission: EM | Admit: 2013-12-25 | Discharge: 2013-12-25 | Disposition: A | Discharge status: Home / Self Care | Payer: Medicaid Other | Attending: Emergency Medicine | Admitting: Emergency Medicine

## 2013-12-25 DIAGNOSIS — M545 Low back pain, unspecified: Secondary | ICD-10-CM

## 2013-12-25 DIAGNOSIS — R519 Headache, unspecified: Secondary | ICD-10-CM

## 2013-12-25 DIAGNOSIS — R51 Headache: Secondary | ICD-10-CM | POA: Diagnosis present

## 2013-12-25 MED ORDER — KETOROLAC TROMETHAMINE 10 MG PO TABS: 10.0000 mg | ORAL_TABLET | Freq: Four times a day (QID) | ORAL | Status: DC | PRN

## 2013-12-25 MED ORDER — OXYCODONE-ACETAMINOPHEN 5-325 MG PO TABS
1.0000 | ORAL_TABLET | Freq: Once | ORAL | Status: AC
  Administered 2013-12-25: 1 via ORAL
  Filled 2013-12-25: qty 1
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
## 2013-12-25 NOTE — Discharge Instructions (Signed)
1. Medications: torradol as needed for return headache, usual home medications 2. Treatment: rest, drink plenty of fluids,  3. Follow Up: Please followup with your primary doctor in 3 days for discussion of your diagnoses and further evaluation after today's visit; if you do not have a primary care doctor use the resource guide provided to find one; Please return to the ED for worsening headache, vision changes, loss of bowel or bladder control, numbness, weakness, or other concerning symptoms  General Headache Without Cause A headache is pain or discomfort felt around the head or neck area. The specific cause of a headache may not be found. There are many causes and types of headaches. A few common ones are:  Tension headaches.  Migraine headaches.  Cluster headaches.  Chronic daily headaches. HOME CARE INSTRUCTIONS   Keep all follow-up appointments with your caregiver or any specialist referral.  Only take over-the-counter or prescription medicines for pain or discomfort as directed by your caregiver.  Lie down in a dark, quiet room when you have a headache.  Keep a headache journal to find out what may trigger your migraine headaches. For example, write down:  What you eat and drink.  How much sleep you get.  Any change to your diet or medicines.  Try massage or other relaxation techniques.  Put ice packs or heat on the head and neck. Use these 3 to 4 times per day for 15 to 20 minutes each time, or as needed.  Limit stress.  Sit up straight, and do not tense your muscles.  Quit smoking if you smoke.  Limit alcohol use.  Decrease the amount of caffeine you drink, or stop drinking caffeine.  Eat and sleep on a regular schedule.  Get 7 to 9 hours of sleep, or as recommended by your caregiver.  Keep lights dim if bright lights bother you and make your headaches worse. SEEK MEDICAL CARE IF:   You have problems with the medicines you were prescribed.  Your medicines  are not working.  You have a change from the usual headache.  You have nausea or vomiting. SEEK IMMEDIATE MEDICAL CARE IF:   Your headache becomes severe.  You have a fever.  You have a stiff neck.  You have loss of vision.  You have muscular weakness or loss of muscle control.  You start losing your balance or have trouble walking.  You feel faint or pass out.  You have severe symptoms that are different from your first symptoms. MAKE SURE YOU:   Understand these instructions.  Will watch your condition.  Will get help right away if you are not doing well or get worse. Document Released: 03/26/2005 Document Revised: 06/18/2011 Document Reviewed: 04/11/2011 [facility] Patient Information 2015 [place], [place]. This information is not intended to replace advice given to you by your health care provider. Make sure you discuss any questions you have with your health care provider.

## 2013-12-25 NOTE — ED Notes (Signed)
Pt requesting bus pass when she get discharged.

## 2013-12-25 NOTE — ED Notes (Signed)
Per pt sts migraine x 3 days. Taking tylenol and ASA. sts yesterday the tylenol worked but today not better. sts also some lower back pain.

## 2013-12-26 NOTE — ED Provider Notes (Signed)
Medical screening examination/treatment/procedure(s) were performed by non-physician practitioner and as supervising physician I was immediately available for consultation/collaboration.   EKG Interpretation None        Rolan Bucco, MD 12/26/13 0022

## 2014-11-13 ENCOUNTER — Emergency Department (HOSPITAL_COMMUNITY)
Admission: EM | Admit: 2014-11-13 | Discharge: 2014-11-13 | Disposition: A | Discharge status: Home / Self Care | Payer: Medicaid Other | Attending: Emergency Medicine | Admitting: Emergency Medicine

## 2014-11-13 ENCOUNTER — Encounter (HOSPITAL_COMMUNITY): Payer: Self-pay | Admitting: Emergency Medicine

## 2014-11-13 DIAGNOSIS — H9202 Otalgia, left ear: Secondary | ICD-10-CM | POA: Diagnosis present

## 2014-11-13 DIAGNOSIS — H6122 Impacted cerumen, left ear: Secondary | ICD-10-CM | POA: Diagnosis not present

## 2014-11-13 DIAGNOSIS — H6500 Acute serous otitis media, unspecified ear: Secondary | ICD-10-CM | POA: Insufficient documentation

## 2014-11-13 MED ORDER — AMOXICILLIN 500 MG PO CAPS: 500.0000 mg | ORAL_CAPSULE | Freq: Three times a day (TID) | ORAL | Status: DC

## 2014-11-13 NOTE — ED Notes (Signed)
Declined W/C at D/C and was escorted to lobby by RN. 

## 2014-11-13 NOTE — Discharge Instructions (Signed)
Otitis Media With Effusion Otitis media with effusion is the presence of fluid in the middle ear. This is a common problem in children, which often follows ear infections. It may be present for weeks or longer after the infection. Unlike an acute ear infection, otitis media with effusion refers only to fluid behind the ear drum and not infection. Children with repeated ear and sinus infections and allergy problems are the most likely to get otitis media with effusion. CAUSES  The most frequent cause of the fluid buildup is dysfunction of the eustachian tubes. These are the tubes that drain fluid in the ears to the back of the nose (nasopharynx). SYMPTOMS   The main symptom of this condition is hearing loss. As a result, you or your child may:  Listen to the TV at a loud volume.  Not respond to questions.  Ask "what" often when spoken to.  Mistake or confuse one sound or word for another.  There may be a sensation of fullness or pressure but usually not pain. DIAGNOSIS   Your health care provider will diagnose this condition by examining you or your child's ears.  Your health care provider may test the pressure in you or your child's ear with a tympanometer.  A hearing test may be conducted if the problem persists. TREATMENT   Treatment depends on the duration and the effects of the effusion.  Antibiotics, decongestants, nose drops, and cortisone-type drugs (tablets or nasal spray) may not be helpful.  Children with persistent ear effusions may have delayed language or behavioral problems. Children at risk for developmental delays in hearing, learning, and speech may require referral to a specialist earlier than children not at risk.  You or your child's health care provider may suggest a referral to an ear, nose, and throat surgeon for treatment. The following may help restore normal hearing:  Drainage of fluid.  Placement of ear tubes (tympanostomy tubes).  Removal of adenoids  (adenoidectomy). HOME CARE INSTRUCTIONS   Avoid secondhand smoke.  Infants who are breastfed are less likely to have this condition.  Avoid feeding infants while they are lying flat.  Avoid known environmental allergens.  Avoid people who are sick. SEEK MEDICAL CARE IF:   Hearing is not better in 3 months.  Hearing is worse.  Ear pain.  Drainage from the ear.  Dizziness. MAKE SURE YOU:   Understand these instructions.  Will watch your condition.  Will get help right away if you are not doing well or get worse. Document Released: 05/03/2004 Document Revised: 08/10/2013 Document Reviewed: 10/21/2012 [facility] Patient Information 2015 [place], [place]. This information is not intended to replace advice given to you by your health care provider. Make sure you discuss any questions you have with your health care provider.  Cerumen Impaction A cerumen impaction is when the wax in your ear forms a plug. This plug usually causes reduced hearing. Sometimes it also causes an earache or dizziness. Removing a cerumen impaction can be difficult and painful. The wax sticks to the ear canal. The canal is sensitive and bleeds easily. If you try to remove a heavy wax buildup with a cotton tipped swab, you may push it in further. Irrigation with water, suction, and small ear curettes may be used to clear out the wax. If the impaction is fixed to the skin in the ear canal, ear drops may be needed for a few days to loosen the wax. People who build up a lot of wax frequently can use  ear wax removal products available in your local drugstore. SEEK MEDICAL CARE IF:  You develop an earache, increased hearing loss, or marked dizziness. Document Released: 05/03/2004 Document Revised: 06/18/2011 Document Reviewed: 06/23/2009 [facility] Patient Information 2015 [place], [place]. This information is not intended to replace advice given to you by your health care provider. Make sure you discuss any questions you  have with your health care provider.

## 2014-11-13 NOTE — ED Notes (Signed)
Pt states that she has had ear pain for a week in the left ear

## 2014-11-13 NOTE — ED Provider Notes (Addendum)
CSN: [id number]     Arrival date & time 11/13/14  1213 History  This chart was scribed for Rhonda Horseman, PA-C, working with Azalia Bilis, MD by Chestine Spore, ED Scribe. The patient was seen in room TR06C/TR06C at 12:36 PM.    Chief Complaint  Patient presents with  . Otalgia      The history is provided by the patient. No language interpreter was used.    HPI Comments: Rhonda Rios is a 44 y.o. female who presents to the Emergency Department complaining of intermittent left ear pain onset 1 week. She states that she is having associated symptoms of decreased hearing in the left ear and fever that resolved with tylenol yesterday. She denies sore throat, chills, ear discharge, and any other symptoms. Pt tried OTC cerumen removal kits at home x 3 days with no relief for her symptoms. Denies sick contacts and this is her first time having an ear ache. Pt denies having diabetes or swimming recently. Pt denies allergies to antibiotics.   History reviewed. No pertinent past medical history. History reviewed. No pertinent past surgical history. History reviewed. No pertinent family history. History  Substance Use Topics  . Smoking status: Never Smoker   . Smokeless tobacco: Not on file  . Alcohol Use: No   OB History    No data available     Review of Systems  HENT: Positive for ear pain. Negative for ear discharge and sore throat.   Skin: Negative for color change and rash.      Allergies  Review of patient's allergies indicates no known allergies.  Home Medications   Prior to Admission medications   Medication Sig Start Date End Date Taking? Authorizing Provider  acetaminophen (TYLENOL) 325 MG tablet Take 650 mg by mouth every 6 (six) hours as needed for mild pain.     Historical Provider, MD  aspirin-acetaminophen-caffeine (EXCEDRIN MIGRAINE) [phone] MG per tablet Take 2 tablets by mouth every 6 (six) hours as needed for headache. 09/19/13   Junius Finner, PA-C   ibuprofen (ADVIL,MOTRIN) 200 MG tablet Take 400 mg by mouth every 6 (six) hours as needed for moderate pain.     Historical Provider, MD  ketorolac (TORADOL) 10 MG tablet Take 1 tablet (10 mg total) by mouth every 6 (six) hours as needed (for headache). 12/25/13   Hannah Muthersbaugh, PA-C   BP 144/86 mmHg  Pulse 70  Temp(Src) 98.7 F (37.1 C) (Oral)  Resp 16  Ht  (1.549 m)  Wt 161 lb (73.029 kg)  BMI 30.44 kg/m2  SpO2 100% Physical Exam  Constitutional: She is oriented to person, place, and time. She appears well-developed and well-nourished. No distress.  HENT:  Head: Normocephalic and atraumatic.  Right Ear: Tympanic membrane and ear canal normal.  Left Ear: Tympanic membrane is erythematous.  Some debris and cerumen in left ear.  Eyes: EOM are normal.  Neck: Neck supple. No tracheal deviation present.  Cardiovascular: Normal rate.   Pulmonary/Chest: Effort normal. No respiratory distress.  Musculoskeletal: Normal range of motion.  Neurological: She is alert and oriented to person, place, and time.  Skin: Skin is warm and dry.  Psychiatric: She has a normal mood and affect. Her behavior is normal.  Nursing note and vitals reviewed.   ED Course  EAR CERUMEN REMOVAL Date/Time: 12/03/2014 4:17 PM Performed by: Rhonda Rios Authorized by: Rhonda Rios Consent: Verbal consent obtained. Risks and benefits: risks, benefits and alternatives were discussed Consent given by: patient Patient understanding:  patient states understanding of the procedure being performed Patient consent: the patient's understanding of the procedure matches consent given Procedure consent: procedure consent matches procedure scheduled Relevant documents: relevant documents present and verified Test results: test results available and properly labeled Site marked: the operative site was marked Imaging studies: imaging studies available Required items: required blood products, implants,  devices, and special equipment available Patient identity confirmed: verbally with patient Local anesthetic: none Procedure type: irrigation Patient sedated: no Patient tolerance: Patient tolerated the procedure well with no immediate complications   (including critical care time) DIAGNOSTIC STUDIES: Oxygen Saturation is 100% on RA, nl by my interpretation.    COORDINATION OF CARE: 12:41 PM-Discussed treatment plan which includes abx Rx and ear irrigation with pt at bedside and pt agreed to plan.   Labs Review Labs Reviewed - No data to display  Imaging Review No results found.   EKG Interpretation None      MDM   Final diagnoses:  Acute serous otitis media, recurrence not specified, unspecified laterality  Cerumen impaction, left    Patient with erythematous left tympanic membrane and some cerumen impaction. Will irrigate here in emergency department, I also feel that antibiotics are indicated given the amount of erythema. Will discharge with amoxicillin. Recommend primary care follow-up. Patient understands and agrees with the plan. She is stable and ready for discharge.  I personally performed the services described in this documentation, which was scribed in my presence. The recorded information has been reviewed and is accurate.    Rhonda Horseman, PA-C 11/13/14 1245  Azalia Bilis, MD 11/13/14 1619  Rhonda Horseman, PA-C 12/03/14 8295  Azalia Bilis, MD 12/03/14 [phone]

## 2015-02-03 ENCOUNTER — Ambulatory Visit: Payer: Medicaid Other | Admitting: Physical Therapy

## 2015-02-04 ENCOUNTER — Ambulatory Visit: Payer: Medicaid Other | Attending: Primary Care | Admitting: Physical Therapy

## 2015-02-04 DIAGNOSIS — M5441 Lumbago with sciatica, right side: Secondary | ICD-10-CM | POA: Insufficient documentation
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
## 2015-02-04 NOTE — Patient Instructions (Signed)
Handouts on prone press ups, abdominal brace, sitting posture correction

## 2016-12-31 ENCOUNTER — Emergency Department (HOSPITAL_COMMUNITY)
Admission: EM | Admit: 2016-12-31 | Discharge: 2016-12-31 | Disposition: A | Discharge status: Home / Self Care | Payer: Self-pay | Attending: Emergency Medicine | Admitting: Emergency Medicine

## 2016-12-31 ENCOUNTER — Emergency Department (HOSPITAL_COMMUNITY): Payer: Self-pay

## 2016-12-31 ENCOUNTER — Encounter (HOSPITAL_COMMUNITY): Payer: Self-pay

## 2016-12-31 DIAGNOSIS — J45909 Unspecified asthma, uncomplicated: Secondary | ICD-10-CM | POA: Insufficient documentation

## 2016-12-31 DIAGNOSIS — R079 Chest pain, unspecified: Secondary | ICD-10-CM | POA: Insufficient documentation

## 2016-12-31 DIAGNOSIS — M25562 Pain in left knee: Secondary | ICD-10-CM | POA: Insufficient documentation

## 2016-12-31 DIAGNOSIS — G8929 Other chronic pain: Secondary | ICD-10-CM

## 2016-12-31 HISTORY — DX: Unspecified asthma, uncomplicated: J45.909

## 2016-12-31 HISTORY — DX: Anxiety disorder, unspecified: F41.9

## 2016-12-31 LAB — CBC
HCT: 29 % — ABNORMAL LOW (ref 36.0–46.0)
Hemoglobin: 8.5 g/dL — ABNORMAL LOW (ref 12.0–15.0)
MCH: 20.1 pg — ABNORMAL LOW (ref 26.0–34.0)
MCHC: 29.3 g/dL — ABNORMAL LOW (ref 30.0–36.0)
MCV: 68.6 fL — ABNORMAL LOW (ref 78.0–100.0)
Platelets: 305 10*3/uL (ref 150–400)
RBC: 4.23 MIL/uL (ref 3.87–5.11)
RDW: 17.8 % — ABNORMAL HIGH (ref 11.5–15.5)
WBC: 6.3 10*3/uL (ref 4.0–10.5)

## 2016-12-31 LAB — BASIC METABOLIC PANEL
Anion gap: 4 — ABNORMAL LOW (ref 5–15)
BUN: 5 mg/dL — ABNORMAL LOW (ref 6–20)
CO2: 28 mmol/L (ref 22–32)
Calcium: 9.1 mg/dL (ref 8.9–10.3)
Chloride: 106 mmol/L (ref 101–111)
Creatinine, Ser: 0.71 mg/dL (ref 0.44–1.00)
GFR calc Af Amer: >60 mL/min (ref >60)
GFR calc non Af Amer: >60 mL/min (ref >60)
Glucose, Bld: 86 mg/dL (ref 65–99)
Potassium: 3.5 mmol/L (ref 3.5–5.1)
SODIUM: 138 mmol/L (ref 135–145)

## 2016-12-31 LAB — I-STAT TROPONIN, ED: Troponin i, poc: 0 ng/mL (ref 0.00–0.08)

## 2016-12-31 MED ORDER — ALBUTEROL SULFATE HFA 108 (90 BASE) MCG/ACT IN AERS: 2.0000 | INHALATION_SPRAY | RESPIRATORY_TRACT | 1 refills | Status: DC | PRN

## 2016-12-31 MED ORDER — ALBUTEROL SULFATE HFA 108 (90 BASE) MCG/ACT IN AERS
2.0000 | INHALATION_SPRAY | Freq: Once | RESPIRATORY_TRACT | Status: AC
  Administered 2016-12-31: 2 via RESPIRATORY_TRACT
  Filled 2016-12-31: qty 6.7

## 2016-12-31 NOTE — ED Notes (Signed)
Pt. Would like an inhaler prior to leaving she has a hx of asthma and does nolt have a rescue inhaler.

## 2016-12-31 NOTE — ED Notes (Signed)
Brought pt back to treatment room, pt refusing to change into gown and be placed on heart monitor. "They done did all that already, I don't have time for all that, I have to catch the last bus." Primary RN made aware of the same.

## 2016-12-31 NOTE — ED Provider Notes (Signed)
[facility] Provider Note   CSN: [id number] Arrival date & time: 12/31/16  1413     History   Chief Complaint Chief Complaint  Patient presents with  . Chest Pain  . Knee Pain    HPI Rhonda Rios is a 46 y.o. female.  HPI   Rhonda Rios is a 46 y.o. female, with a history of asthma and anxiety, presenting to the ED with Chest pain for the past several months. Pain is central chest, described as a sharp "twinge," lasts for a few seconds to a few minutes, intermittent, moderate to severe, nonradiating. Patient states, "I have been to my doctor many times for this and have had a lot of tests, but they never find anything." Patient states she has also been to the cardiologist and been cleared. She states they have also done "a test for a blood clot in the lung" that was negative. She presents today stating, "I thought you all might find something different." Patient adds that she is out of her albuterol inhaler. Also endorses intermittent shortness of breath, lightheadedness, and nausea. Patient has not had an instance of any of these complaints during her ED course.  Patient also complains of left knee pain for the last several months. States that she slipped and fell, striking the knee. She has pain with ambulation and rest with intermittent swelling. Pain is moderate, throbbing, mostly lateral, nonradiating. She states she has been evaluated by her PCP and an orthopedist for this issue, "but they didn't find anything."  Denies numbness, weakness, headache, current shortness breath, current dizziness, current nausea, vomiting/diarrhea, abdominal pain, cough, fever/chills, peripheral edema, or any other complaints.    Past Medical History:  Diagnosis Date  . Anxiety   . Asthma     There are no active problems to display for this patient.   History reviewed. No pertinent surgical history.  OB History    No data available       Home Medications    Prior  to Admission medications   Medication Sig Start Date End Date Taking? Authorizing Provider  acetaminophen (TYLENOL) 325 MG tablet Take 650 mg by mouth every 6 (six) hours as needed for mild pain.     [provider]  albuterol (PROVENTIL HFA;VENTOLIN HFA) 108 (90 Base) MCG/ACT inhaler Inhale 2 puffs into the lungs every 4 (four) hours as needed for wheezing or shortness of breath. 12/31/16   Joy, Shawn C, PA-C  amoxicillin (AMOXIL) 500 MG capsule Take 1 capsule (500 mg total) by mouth 3 (three) times daily. 11/13/14   Roxy Horseman, PA-C  aspirin-acetaminophen-caffeine (EXCEDRIN MIGRAINE) [phone] MG per tablet Take 2 tablets by mouth every 6 (six) hours as needed for headache. 09/19/13   Lurene Shadow, PA-C  ibuprofen (ADVIL,MOTRIN) 200 MG tablet Take 400 mg by mouth every 6 (six) hours as needed for moderate pain.     [provider]  ketorolac (TORADOL) 10 MG tablet Take 1 tablet (10 mg total) by mouth every 6 (six) hours as needed (for headache). 12/25/13   Muthersbaugh, Dahlia Client, PA-C    Family History No family history on file.  Social History Social History  Substance Use Topics  . Smoking status: Never Smoker  . Smokeless tobacco: Never Used  . Alcohol use No     Allergies   Patient has no known allergies.   Review of Systems Review of Systems  Constitutional: Negative for chills, diaphoresis, fatigue and fever.  Respiratory: Negative for cough and  shortness of breath.   Cardiovascular: Positive for chest pain (resolved).  Gastrointestinal: Positive for nausea (resolved). Negative for abdominal pain, diarrhea and vomiting.  Musculoskeletal: Positive for arthralgias and joint swelling.  Neurological: Positive for dizziness (resolved). Negative for weakness, numbness and headaches.  All other systems reviewed and are negative.    Physical Exam Updated Vital Signs BP (!) 156/81 (BP Location: Right Arm)   Pulse 75   Temp 98.3 F (36.8 C) (Oral)   Resp  18   Ht  (1.549 m)   Wt 72.6 kg (160 lb)   LMP 12/28/2016   SpO2 100%   BMI 30.23 kg/m   Physical Exam  Constitutional: She appears well-developed and well-nourished. No distress.  HENT:  Head: Normocephalic and atraumatic.  Eyes: Conjunctivae are normal.  Neck: Neck supple.  Cardiovascular: Normal rate, regular rhythm, normal heart sounds and intact distal pulses.   Pulmonary/Chest: Effort normal and breath sounds normal. No respiratory distress.  Abdominal: Soft. There is no tenderness. There is no guarding.  Musculoskeletal: She exhibits tenderness. She exhibits no edema.  Tenderness to the left lateral knee without noted swelling, crepitus, deformity, erythema, or laxity. Pain elicited to the lateral side with her as an valgus stress. Patient is ambulatory without assistance. Full passive and active range of motion in the left knee and ankle. No calf tenderness, erythema, swelling, or increased warmth bilaterally.  Lymphadenopathy:    She has no cervical adenopathy.  Neurological: She is alert.  No noted sensory deficits in the lower extremities. Strength 5/5 with flexion and extension of the bilateral knees and with plantar and dorsiflexion bilaterally.  Skin: Skin is warm and dry. Capillary refill takes less than 2 seconds. She is not diaphoretic.  Psychiatric: She has a normal mood and affect. Her behavior is normal.  Nursing note and vitals reviewed.    ED Treatments / Results  Labs (all labs ordered are listed, but only abnormal results are displayed) Labs Reviewed  BASIC METABOLIC PANEL - Abnormal; Notable for the following:       Result Value   BUN 5 (*)    Anion gap 4 (*)    All other components within normal limits  CBC - Abnormal; Notable for the following:    Hemoglobin 8.5 (*)    HCT 29.0 (*)    MCV 68.6 (*)    MCH 20.1 (*)    MCHC 29.3 (*)    RDW 17.8 (*)    All other components within normal limits  I-STAT TROPONIN, ED    EKG  EKG  Interpretation None       Radiology Dg Chest 2 View  Result Date: 12/31/2016 CLINICAL DATA:  Intermittent sharp chest pain EXAM: CHEST  2 VIEW COMPARISON:  None. FINDINGS: Heart and mediastinal contours are within normal limits. No focal opacities or effusions. No acute bony abnormality. IMPRESSION: No active cardiopulmonary disease. Electronically Signed   By: Charlett Nose M.D.   On: 12/31/2016 15:25    Procedures ED EKG within 10 minutes Date/Time: 12/31/2016 6:30 PM Performed by: Anselm Pancoast Authorized by: Benjiman Core   ECG reviewed by ED Physician in the absence of a cardiologist: yes   Previous ECG:    Previous ECG:  Unavailable Interpretation:    Interpretation: non-specific   Quality:    Tracing quality:  Limited by artifact Rate:    ECG rate:  80   ECG rate assessment: normal   Rhythm:    Rhythm: sinus rhythm  Ectopy:    Ectopy: none   QRS:    QRS axis:  Normal   QRS intervals:  Normal ST segments:    ST segments:  Normal T waves:    T waves: flattening   Other findings:    Other findings comment:  QTc   (including critical care time)  Medications Ordered in ED Medications  albuterol (PROVENTIL HFA;VENTOLIN HFA) 108 (90 Base) MCG/ACT inhaler 2 puff (2 puffs Inhalation Given 12/31/16 2147)     Initial Impression / Assessment and Plan / ED Course  I have reviewed the triage vital signs and the nursing notes.  Pertinent labs & imaging results that were available during my care of the patient were reviewed by me and considered in my medical decision making (see chart for details).     Patient presents with complaint of chest pain and knee pain. Patient is nontoxic appearing, afebrile, not tachycardic, not tachypneic, not hypotensive, maintains SPO2 of 97-100% on room air, and is in no apparent distress.  Continued evaluation was discussed at length with the patient, including d-dimer and second troponin. Patient states, "they've already done  all of that, there is no need to do it again." HEART score is 1, indicating low risk for a cardiac event. Wells criteria score is 0, indicating low risk for PE.  I do not have a record of the patient's visits to her PCP or the specialists, nor do a record of previous imaging or lab results. This was brought up to the patient, but she states, "They already checked me out multiple times for these things." Patient was given resources for orthopedic and cardiology follow-up. The patient was given instructions for home care as well as return precautions. Patient voices understanding of these instructions, accepts the plan, and is comfortable with discharge.  Vitals:   12/31/16 1426 12/31/16 1439 12/31/16 2013  BP: (!) 150/101  (!) 156/81  Pulse: 87  75  Resp: 18  18  Temp: 98.4 F (36.9 C)  98.3 F (36.8 C)  TempSrc: Oral  Oral  SpO2: 97%  100%  Weight:  72.6 kg (160 lb)   Height:   (1.549 m)       Final Clinical Impressions(s) / ED Diagnoses   Final diagnoses:  Chronic pain of left knee  Chest pain, unspecified type    New Prescriptions Discharge Medication List as of 12/31/2016  9:40 PM    START taking these medications   Details  albuterol (PROVENTIL HFA;VENTOLIN HFA) 108 (90 Base) MCG/ACT inhaler Inhale 2 puffs into the lungs every 4 (four) hours as needed for wheezing or shortness of breath., Starting Mon 12/31/2016, Print         Joy, Shawn C, PA-C 01/01/17 Lloyd Huger, MD 01/03/17 [phone]
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
## 2016-12-31 NOTE — ED Triage Notes (Signed)
Intermittent sharp  Central non-radiating chest pain.  Began yesterday, tylenol relives the pain,  Nothing makes it worse.  Pt. Was having sob and nausea.  Pt. Also having intermittent dizziness.  Hx of migraines and anxiety.  Pt. Has a hx of bilateral knee pain.  Today her lt.knee is worse and she is having difficulty walking on it. It will give out on her. AT present time, chest pain is a 3/10 and her knee pain 8/10.  Skin is p/w/ed. Alert and oriented X4  ECG and labs completed in triage. Unable to assess knee, pt. Has jeans on.

## 2016-12-31 NOTE — Discharge Instructions (Signed)
There were no acute abnormalities noted on the lab work. There was some anemia, which you have stated is normal for you. Please follow up with cardiology and your primary care provider on your chest pain.  Knee pain: You have been seen today for knee pain. There were no acute abnormalities on the x-rays, including no sign of fracture or dislocation. Pain: Take 600 mg of ibuprofen every 6 hours or 440 mg (over the counter dose) to 500 mg (prescription dose) of naproxen every 12 hours for the next 3 days. After this time, these medications may be used as needed for pain. Take these medications with food to avoid upset stomach. Choose only one of these medications, do not take them together.  Tylenol: Should you continue to have additional pain while taking the ibuprofen or naproxen, you may add in tylenol as needed. Your daily total maximum amount of tylenol from all sources should be limited to /day for persons without liver problems, or /day for those with liver problems. Ice: May apply ice to the area over the next 24 hours for 15 minutes at a time to reduce swelling. Elevation: Keep the extremity elevated as often as possible to reduce pain and inflammation. Support: Wear the knee sleeve for support and comfort. Wear this until pain resolves.  Exercises: Start by performing these exercises a few times a week, increasing the frequency until you are performing them twice daily.  Follow up: Follow up with the orthopedist on your knee pain.

## 2016-12-31 NOTE — ED Notes (Signed)
Pt stable, ambulatory, states understanding of discharge instructions 

## 2016-12-31 NOTE — ED Notes (Signed)
Pt stated that she was "going to the cafeteria". 

## 2017-08-16 ENCOUNTER — Emergency Department (HOSPITAL_COMMUNITY)
Admission: EM | Admit: 2017-08-16 | Discharge: 2017-08-16 | Disposition: A | Discharge status: Home / Self Care | Payer: No Typology Code available for payment source

## 2017-08-16 NOTE — ED Notes (Signed)
Pt approached desk stating she wanted to leave because of the wait time. Pt was encouraged to stay and was informed she was waiting for triage. Pt still wanted to leave and stated she would return in the morning.

## 2017-08-16 NOTE — ED Triage Notes (Signed)
No answer when called and waiting room people reports that the pt left

## 2017-08-16 NOTE — ED Notes (Signed)
Called pt name for triage x3, no response from pt.

## 2018-02-19 ENCOUNTER — Encounter (HOSPITAL_COMMUNITY): Payer: Self-pay

## 2018-02-19 ENCOUNTER — Ambulatory Visit (HOSPITAL_COMMUNITY)
Admission: EM | Admit: 2018-02-19 | Discharge: 2018-02-19 | Disposition: A | Discharge status: Home / Self Care | Payer: Self-pay | Attending: Family Medicine | Admitting: Family Medicine

## 2018-02-19 ENCOUNTER — Other Ambulatory Visit: Payer: Self-pay

## 2018-02-19 DIAGNOSIS — B9789 Other viral agents as the cause of diseases classified elsewhere: Secondary | ICD-10-CM

## 2018-02-19 DIAGNOSIS — J069 Acute upper respiratory infection, unspecified: Secondary | ICD-10-CM

## 2018-02-19 MED ORDER — BENZONATATE 100 MG PO CAPS: 100.0000 mg | ORAL_CAPSULE | Freq: Three times a day (TID) | ORAL | 0 refills | Status: DC

## 2018-02-19 MED ORDER — ALBUTEROL SULFATE HFA 108 (90 BASE) MCG/ACT IN AERS
INHALATION_SPRAY | RESPIRATORY_TRACT | Status: AC
  Filled 2018-02-19: qty 6.7

## 2018-02-19 NOTE — ED Triage Notes (Signed)
Pt  Cc chest pain from coughing and back pain x 2 days the worst pain ever .the pt states she has blackout spells but that's a on going issue.

## 2018-02-19 NOTE — ED Provider Notes (Signed)
[facility]    CSN: [id number] Arrival date & time: 02/19/18  1432     History   Chief Complaint Chief Complaint  Patient presents with  . Back Pain  . Cough    HPI Rhonda Rios is a 47 y.o. female.   Patient is a 47 year old female with past medical history of asthma that presents for 2 days of cough, congestion, back pain.  Her symptoms have been constant and remain the same.  She has been using her albuterol inhaler and Tylenol for her symptoms that.  Pain in her back is present when coughing.  She is been coughing up some yellow sputum.  She reports waxing and waning low-grade fever, chills.  Denies any recent sick contacts.  Denies any shortness of breath, chest pain, palpitations.  ROS per HPI      Past Medical History:  Diagnosis Date  . Anxiety   . Asthma     There are no active problems to display for this patient.   History reviewed. No pertinent surgical history.  OB History   None      Home Medications    Prior to Admission medications   Medication Sig Start Date End Date Taking? Authorizing Provider  acetaminophen (TYLENOL) 325 MG tablet Take 650 mg by mouth every 6 (six) hours as needed for mild pain.     [provider]  albuterol (PROVENTIL HFA;VENTOLIN HFA) 108 (90 Base) MCG/ACT inhaler Inhale 2 puffs into the lungs every 4 (four) hours as needed for wheezing or shortness of breath. 12/31/16   Joy, Shawn C, PA-C  amoxicillin (AMOXIL) 500 MG capsule Take 1 capsule (500 mg total) by mouth 3 (three) times daily. Patient not taking: Reported on 02/19/2018 11/13/14   Roxy Horseman, PA-C  aspirin-acetaminophen-caffeine (EXCEDRIN MIGRAINE) [phone] MG per tablet Take 2 tablets by mouth every 6 (six) hours as needed for headache. Patient not taking: Reported on 02/19/2018 09/19/13   Lurene Shadow, PA-C  benzonatate (TESSALON) 100 MG capsule Take 1 capsule (100 mg total) by mouth every 8 (eight) hours. 02/19/18   Dahlia Byes  A, NP  ibuprofen (ADVIL,MOTRIN) 200 MG tablet Take 400 mg by mouth every 6 (six) hours as needed for moderate pain.     [provider]  ketorolac (TORADOL) 10 MG tablet Take 1 tablet (10 mg total) by mouth every 6 (six) hours as needed (for headache). Patient not taking: Reported on 02/19/2018 12/25/13   Muthersbaugh, Dahlia Client, PA-C    Family History Family History  Problem Relation Age of Onset  . Stroke Father   . Hypertension Father     Social History Social History   Tobacco Use  . Smoking status: Never Smoker  . Smokeless tobacco: Never Used  Substance Use Topics  . Alcohol use: No  . Drug use: No     Allergies   Patient has no known allergies.   Review of Systems Review of Systems   Physical Exam Triage Vital Signs ED Triage Vitals  Enc Vitals Group     BP 02/19/18 1450 (!) 147/86     Pulse Rate 02/19/18 1450 78     Resp 02/19/18 1450 18     Temp 02/19/18 1450 98.1 F (36.7 C)     Temp Source 02/19/18 1450 Oral     SpO2 02/19/18 1450 98 %     Weight 02/19/18 1448 160 lb (72.6 kg)     Height --      Head Circumference --  Peak Flow --      Pain Score 02/19/18 1447 10     Pain Loc --      Pain Edu? --      Excl. in GC? --    No data found.  Updated Vital Signs BP (!) 147/86 (BP Location: Right Arm)   Pulse 78   Temp 98.1 F (36.7 C) (Oral)   Resp 18   Wt 160 lb (72.6 kg)   LMP 02/03/2018   SpO2 98%   BMI 30.23 kg/m   Visual Acuity Right Eye Distance:   Left Eye Distance:   Bilateral Distance:    Right Eye Near:   Left Eye Near:    Bilateral Near:     Physical Exam  Constitutional: She is oriented to person, place, and time. She appears well-developed and well-nourished.  HENT:  Head: Normocephalic and atraumatic.  Right Ear: Hearing, tympanic membrane and external ear normal.  Left Ear: Hearing, tympanic membrane and external ear normal.  Mouth/Throat: Uvula is midline, oropharynx is clear and moist and mucous membranes  are normal. No oropharyngeal exudate, posterior oropharyngeal edema, posterior oropharyngeal erythema or tonsillar abscesses. Tonsils are 0 on the right. Tonsils are 0 on the left. No tonsillar exudate.  Eyes: Conjunctivae are normal.  Neck: Normal range of motion.  Cardiovascular: Normal rate and regular rhythm.  Pulmonary/Chest: Effort normal and breath sounds normal. No stridor. No respiratory distress. She has no wheezes. She has no rales. She exhibits no tenderness.  Musculoskeletal: Normal range of motion.  Lymphadenopathy:    She has no cervical adenopathy.  Neurological: She is alert and oriented to person, place, and time.  Skin: Skin is warm and dry.  Psychiatric: She has a normal mood and affect.  Nursing note and vitals reviewed.    UC Treatments / Results  Labs (all labs ordered are listed, but only abnormal results are displayed) Labs Reviewed - No data to display  EKG None  Radiology No results found.  Procedures Procedures (including critical care time)  Medications Ordered in UC Medications - No data to display  Initial Impression / Assessment and Plan / UC Course  I have reviewed the triage vital signs and the nursing notes.  Pertinent labs & imaging results that were available during my care of the patient were reviewed by me and considered in my medical decision making (see chart for details).     Patient is a 47 year old female past medical history of asthma presenting for 2 days of cough, congestion. Exam normal, lungs clear Vital signs stable.  Patient nontoxic or ill-appearing. Most likely viral illness Will treat cough with Tessalon Perles and Mucinex for congestion. Patient given albuterol inhaler here in clinic for cough, wheezing, shortness of breath. Follow up as needed for continued or worsening symptoms  Final Clinical Impressions(s) / UC Diagnoses   Final diagnoses:  Viral URI with cough     Discharge Instructions     I believe  this is a viral infection We will treat the cough with tessalon pearls Albuterol inhaler as needed for cough, wheezing, shortness of breath. You can do mucinex OTC for congestion On all or ibuprofen for pain/fever Follow up as needed for continued or worsening symptoms      ED Prescriptions    Medication Sig Dispense Auth. Provider   benzonatate (TESSALON) 100 MG capsule Take 1 capsule (100 mg total) by mouth every 8 (eight) hours. 21 capsule Dahlia ByesBast, Camille Dragan A, NP     Controlled Substance  Prescriptions [place] Controlled Substance Registry consulted? Not Applicable   Janace Aris, NP 02/19/18 1521

## 2018-02-19 NOTE — Discharge Instructions (Addendum)
I believe this is a viral infection We will treat the cough with tessalon pearls Albuterol inhaler as needed for cough, wheezing, shortness of breath. You can do mucinex OTC for congestion On all or ibuprofen for pain/fever Follow up as needed for continued or worsening symptoms

## 2019-02-23 ENCOUNTER — Other Ambulatory Visit: Payer: Self-pay

## 2019-02-23 ENCOUNTER — Encounter (HOSPITAL_COMMUNITY): Payer: Self-pay | Admitting: Emergency Medicine

## 2019-02-23 ENCOUNTER — Emergency Department (HOSPITAL_COMMUNITY): Payer: No Typology Code available for payment source

## 2019-02-23 ENCOUNTER — Emergency Department (HOSPITAL_COMMUNITY)
Admission: EM | Admit: 2019-02-23 | Discharge: 2019-02-24 | Disposition: A | Discharge status: Home / Self Care | Payer: No Typology Code available for payment source | Attending: Emergency Medicine | Admitting: Emergency Medicine

## 2019-02-23 DIAGNOSIS — J45909 Unspecified asthma, uncomplicated: Secondary | ICD-10-CM | POA: Diagnosis not present

## 2019-02-23 DIAGNOSIS — M546 Pain in thoracic spine: Secondary | ICD-10-CM | POA: Diagnosis not present

## 2019-02-23 DIAGNOSIS — Z79899 Other long term (current) drug therapy: Secondary | ICD-10-CM | POA: Insufficient documentation

## 2019-02-23 DIAGNOSIS — I10 Essential (primary) hypertension: Secondary | ICD-10-CM | POA: Insufficient documentation

## 2019-02-23 HISTORY — DX: Essential (primary) hypertension: I10
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
## 2019-02-24 ENCOUNTER — Emergency Department (HOSPITAL_COMMUNITY): Payer: No Typology Code available for payment source

## 2019-02-24 DIAGNOSIS — M546 Pain in thoracic spine: Secondary | ICD-10-CM | POA: Diagnosis not present

## 2019-02-24 LAB — I-STAT BETA HCG BLOOD, ED (MC, WL, AP ONLY): I-stat hCG, quantitative: <5 m[IU]/mL (ref <5)

## 2019-02-24 MED ORDER — CYCLOBENZAPRINE HCL 5 MG PO TABS: 5.0000 mg | ORAL_TABLET | Freq: Three times a day (TID) | ORAL | 0 refills | Status: AC | PRN

## 2019-02-24 MED ORDER — ACETAMINOPHEN 500 MG PO TABS
1000.0000 mg | ORAL_TABLET | Freq: Once | ORAL | Status: AC
  Administered 2019-02-24: 1000 mg via ORAL
  Filled 2019-02-24: qty 2

## 2019-02-24 MED ORDER — IBUPROFEN 400 MG PO TABS
600.0000 mg | ORAL_TABLET | Freq: Once | ORAL | Status: AC
  Administered 2019-02-24: 600 mg via ORAL
  Filled 2019-02-24: qty 1

## 2019-02-24 NOTE — ED Notes (Signed)
Patient transported to X-ray 

## 2019-02-24 NOTE — ED Notes (Signed)
Pt transported to xray 

## 2019-02-24 NOTE — Discharge Instructions (Signed)
Use ibuprofen, tylenol and ice for pain as needed. Return for weakness, persistent numbness, bowel or bladder changes or new concerns. Use flexeril as needed for muscle spasms.

## 2019-02-24 NOTE — ED Notes (Addendum)
Patient verbalized understanding of dc instructions, vss, ambulatory with nad.  Discharge prescription for flexeril given to patient by dr Reather Converse.
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
## 2019-02-24 NOTE — ED Notes (Signed)
ED Provider at bedside. 

## 2019-03-04 ENCOUNTER — Emergency Department (HOSPITAL_COMMUNITY): Payer: Medicare Other

## 2019-03-04 ENCOUNTER — Emergency Department (HOSPITAL_COMMUNITY)
Admission: EM | Admit: 2019-03-04 | Discharge: 2019-03-04 | Disposition: A | Discharge status: Home / Self Care | Payer: Medicare Other | Attending: Emergency Medicine | Admitting: Emergency Medicine

## 2019-03-04 ENCOUNTER — Encounter (HOSPITAL_COMMUNITY): Payer: Self-pay | Admitting: Emergency Medicine

## 2019-03-04 ENCOUNTER — Other Ambulatory Visit: Payer: Self-pay

## 2019-03-04 DIAGNOSIS — M545 Low back pain: Secondary | ICD-10-CM | POA: Insufficient documentation

## 2019-03-04 DIAGNOSIS — I10 Essential (primary) hypertension: Secondary | ICD-10-CM | POA: Insufficient documentation

## 2019-03-04 DIAGNOSIS — G8911 Acute pain due to trauma: Secondary | ICD-10-CM | POA: Insufficient documentation

## 2019-03-04 DIAGNOSIS — M542 Cervicalgia: Secondary | ICD-10-CM

## 2019-03-04 DIAGNOSIS — F419 Anxiety disorder, unspecified: Secondary | ICD-10-CM | POA: Insufficient documentation

## 2019-03-04 DIAGNOSIS — M79645 Pain in left finger(s): Secondary | ICD-10-CM | POA: Diagnosis not present

## 2019-03-04 MED ORDER — CYCLOBENZAPRINE HCL 5 MG PO TABS: 5.0000 mg | ORAL_TABLET | Freq: Two times a day (BID) | ORAL | 0 refills | Status: AC | PRN

## 2019-03-04 MED ORDER — MELOXICAM 15 MG PO TABS: 15.0000 mg | ORAL_TABLET | Freq: Every day | ORAL | 0 refills | Status: DC

## 2019-03-04 NOTE — Discharge Instructions (Addendum)
As discussed, nothing is broken in your left hand or thumb; however, I put you in a splint due to the thumb pain. I have given you the number for the orthopedic office ([facility]). I recommend calling to schedule an appointment if your symptoms do not improve within the next week. I am sending you home with Meloxicam and Flexeril. Take as prescribed and do not mix with other over the counter medications. Flexeril is a muscle relaxer and can make you drowsy, so do not drive or operate machinery while on the medication. I have also given you the number for [facility]. Call to schedule an appointment to establish care. Return to the ER for new or worsening symptoms.

## 2019-03-04 NOTE — ED Notes (Signed)
Patient verbalizes understanding of discharge instructions. Opportunity for questioning and answers were provided. Armband removed by staff, pt discharged from ED.  
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
## 2020-01-16 ENCOUNTER — Other Ambulatory Visit: Payer: Self-pay

## 2020-01-16 ENCOUNTER — Ambulatory Visit (HOSPITAL_COMMUNITY)
Admission: EM | Admit: 2020-01-16 | Discharge: 2020-01-16 | Disposition: A | Discharge status: Home / Self Care | Payer: Medicare Other | Attending: Family Medicine | Admitting: Family Medicine

## 2020-01-16 ENCOUNTER — Encounter (HOSPITAL_COMMUNITY): Payer: Self-pay | Admitting: *Deleted

## 2020-01-16 DIAGNOSIS — R0981 Nasal congestion: Secondary | ICD-10-CM | POA: Diagnosis not present

## 2020-01-16 DIAGNOSIS — R062 Wheezing: Secondary | ICD-10-CM

## 2020-01-16 DIAGNOSIS — R059 Cough, unspecified: Secondary | ICD-10-CM | POA: Diagnosis not present

## 2020-01-16 DIAGNOSIS — H9201 Otalgia, right ear: Secondary | ICD-10-CM | POA: Diagnosis not present

## 2020-01-16 DIAGNOSIS — I10 Essential (primary) hypertension: Secondary | ICD-10-CM | POA: Insufficient documentation

## 2020-01-16 DIAGNOSIS — J45909 Unspecified asthma, uncomplicated: Secondary | ICD-10-CM | POA: Diagnosis not present

## 2020-01-16 DIAGNOSIS — Z20822 Contact with and (suspected) exposure to covid-19: Secondary | ICD-10-CM | POA: Insufficient documentation

## 2020-01-16 MED ORDER — ALBUTEROL SULFATE HFA 108 (90 BASE) MCG/ACT IN AERS: 1.0000 | INHALATION_SPRAY | Freq: Four times a day (QID) | RESPIRATORY_TRACT | 2 refills | Status: DC | PRN

## 2020-01-16 MED ORDER — ALBUTEROL SULFATE HFA 108 (90 BASE) MCG/ACT IN AERS: 1.0000 | INHALATION_SPRAY | Freq: Four times a day (QID) | RESPIRATORY_TRACT | 2 refills | Status: AC | PRN

## 2020-01-16 NOTE — Discharge Instructions (Addendum)
You have been tested for COVID-19 today. °If your test returns positive, you will receive a phone call from [facility] regarding your results. °Negative test results are not called. °Both positive and negative results area always visible on MyChart. °If you do not have a MyChart account, sign up instructions are provided in your discharge papers. °Please do not hesitate to contact us should you have questions or concerns. ° °

## 2020-01-16 NOTE — ED Triage Notes (Signed)
PT reports ear congestion and pain started 2 days ago. Pt reports wheezing with cough with a hx of asthma. Pt denies any fever,sore throat.

## 2020-01-17 LAB — SARS CORONAVIRUS 2 (TAT 6-24 HRS): SARS Coronavirus 2: NEGATIVE

## 2020-01-18 NOTE — ED Provider Notes (Signed)
[facility] CARE [facility]ENTER   [id number] 01/16/20 Arrival Time: 1301  ASSESSMENT & PLAN:  1. Cough   2. Nasal congestion   3. Wheezing   4. Otalgia, right      COVID-19 testing sent. See letter/work note on file for self-isolation guidelines. OTC symptom care as needed.  Meds ordered this encounter  Medications  . albuterol (VENTOLIN HFA) 108 (90 Base) MCG/ACT inhaler    Sig: Inhale 1-2 puffs into the lungs every 6 (six) hours as needed for wheezing or shortness of breath.    Dispense:  1 each    Refill:  2  . albuterol (VENTOLIN HFA) 108 (90 Base) MCG/ACT inhaler    Sig: Inhale 1-2 puffs into the lungs every 6 (six) hours as needed for wheezing or shortness of breath.    Dispense:  1 each    Refill:  2     Follow-up Information    Medicine, [facility].   Specialty: Family Medicine Why: As needed. Contact information: [address] [place] [address] [phone]               Reviewed expectations re: course of current medical issues. Questions answered. Outlined signs and symptoms indicating need for more acute intervention. Understanding verbalized. After Visit Summary given.   SUBJECTIVE: History from: patient. Rhonda Rios is a 49 y.o. female who reports ear congestion/pressure, cough, wheezing. Abrupt onset two days ago. Denies: fever, sore throat, difficulty breathing and headache. Normal PO intake without n/v/d.    OBJECTIVE:  Vitals:   01/16/20 1503 01/16/20 1506  BP: (!) 151/88   Pulse: 75   Resp: 15   Temp: 98.5 F (36.9 C)   TempSrc: Oral   SpO2: 100%   Weight:  73.5 kg  Height:  5\' 4"  (1.626 m)    General appearance: alert; no distress Eyes: PERRLA; EOMI; conjunctiva normal HENT: [place]; AT; with nasal congestion; TMs normal Neck: supple  Lungs: speaks full sentences without difficulty; mild bilateral exp wheezes Extremities: no edema Skin: warm and dry Neurologic: normal gait Psychological: alert and  cooperative; normal mood and affect  Labs:  Labs Reviewed  SARS CORONAVIRUS 2 (TAT 6-24 HRS)    No Known Allergies  Past Medical History:  Diagnosis Date  . Anxiety   . Asthma   . Hypertension    Social History   Socioeconomic History  . Marital status: Single    Spouse name: Not on file  . Number of children: Not on file  . Years of education: Not on file  . Highest education level: Not on file  Occupational History  . Not on file  Tobacco Use  . Smoking status: Never Smoker  . Smokeless tobacco: Never Used  Substance and Sexual Activity  . Alcohol use: No  . Drug use: No  . Sexual activity: Yes    Birth control/protection: None  Other Topics Concern  . Not on file  Social History Narrative  . Not on file   Social Determinants of Health   Financial Resource Strain:   . Difficulty of Paying Living Expenses: Not on file  Food Insecurity:   . Worried About in the Last Year: Not on file  . Ran Out of Food in the Last Year: Not on file  Transportation Needs:   . Lack of Transportation (Medical): Not on file  . Lack of Transportation (Non-Medical): Not on file  Physical Activity:   . Days of Exercise per Week:  Not on file  . Minutes of Exercise per Session: Not on file  Stress:   . Feeling of Stress : Not on file  Social Connections:   . Frequency of Communication with Friends and Family: Not on file  . Frequency of Social Gatherings with Friends and Family: Not on file  . Attends Religious Services: Not on file  . Active Member of Clubs or Organizations: Not on file  . Attends Banker Meetings: Not on file  . Marital Status: Not on file  Intimate Partner Violence:   . Fear of Current or Ex-Partner: Not on file  . Emotionally Abused: Not on file  . Physically Abused: Not on file  . Sexually Abused: Not on file   Family History  Problem Relation Age of Onset  . Stroke Father   . Hypertension Father    History reviewed.  No pertinent surgical history.   Mardella Layman, MD 01/18/20 [phone]
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
## 2021-03-22 ENCOUNTER — Emergency Department (HOSPITAL_COMMUNITY)
Admission: EM | Admit: 2021-03-22 | Discharge: 2021-03-22 | Disposition: A | Discharge status: Home / Self Care | Payer: Medicare Other | Attending: Emergency Medicine | Admitting: Emergency Medicine

## 2021-03-22 ENCOUNTER — Other Ambulatory Visit: Payer: Self-pay

## 2021-03-22 ENCOUNTER — Encounter (HOSPITAL_COMMUNITY): Payer: Self-pay | Admitting: Emergency Medicine

## 2021-03-22 DIAGNOSIS — J45909 Unspecified asthma, uncomplicated: Secondary | ICD-10-CM | POA: Diagnosis not present

## 2021-03-22 DIAGNOSIS — I10 Essential (primary) hypertension: Secondary | ICD-10-CM | POA: Diagnosis not present

## 2021-03-22 DIAGNOSIS — H9202 Otalgia, left ear: Secondary | ICD-10-CM | POA: Diagnosis present

## 2021-03-22 DIAGNOSIS — Z79899 Other long term (current) drug therapy: Secondary | ICD-10-CM | POA: Diagnosis not present

## 2021-03-22 DIAGNOSIS — H6122 Impacted cerumen, left ear: Secondary | ICD-10-CM | POA: Diagnosis not present

## 2021-03-22 NOTE — ED Triage Notes (Signed)
Pt with muffled hearing and ear pain on the left x 2 weeks. OTC meds haven't helped.  No fevers at home per patient. Some nasal drainage to throat.

## 2021-03-22 NOTE — ED Provider Notes (Signed)
[facility] EMERGENCY DEPARTMENT Provider Note   CSN: [id number] Arrival date & time: 03/22/21  1639     History Chief Complaint  Patient presents with   Ear Pain    Rhonda Rios is a 50 y.o. female.  HPI Patient reports muffled hearing out of the left ear.  No pain no drainage no fever no chills    Past Medical History:  Diagnosis Date   Anxiety    Asthma    Hypertension     There are no problems to display for this patient.   No past surgical history on file.   OB History   No obstetric history on file.     Family History  Problem Relation Age of Onset   Stroke Father    Hypertension Father     Social History   Tobacco Use   Smoking status: Never   Smokeless tobacco: Never  Substance Use Topics   Alcohol use: No   Drug use: No    Home Medications Prior to Admission medications   Medication Sig Start Date End Date Taking? Authorizing Provider  acetaminophen (TYLENOL) 325 MG tablet Take 650 mg by mouth every 6 (six) hours as needed for mild pain.     [provider]  albuterol (VENTOLIN HFA) 108 (90 Base) MCG/ACT inhaler Inhale 1-2 puffs into the lungs every 6 (six) hours as needed for wheezing or shortness of breath. 01/16/20   Mardella Layman, MD  cyclobenzaprine (FLEXERIL) 5 MG tablet Take 1 tablet (5 mg total) by mouth 3 (three) times daily as needed for muscle spasms. 02/24/19   Blane Ohara, MD  cyclobenzaprine (FLEXERIL) 5 MG tablet Take 1 tablet (5 mg total) by mouth 2 (two) times daily as needed for muscle spasms. 03/04/19   Mannie Stabile, PA-C  FLUoxetine (PROZAC) 20 MG capsule Take 60 mg by mouth every morning. 12/22/19   [provider]  ibuprofen (ADVIL,MOTRIN) 200 MG tablet Take 400 mg by mouth every 6 (six) hours as needed for moderate pain.     [provider]  prazosin (MINIPRESS) 1 MG capsule Take 1 mg by mouth at bedtime. 12/22/19   [provider]  QUEtiapine (SEROQUEL)  100 MG tablet Take by mouth. 12/22/19   [provider]  traZODone (DESYREL) 50 MG tablet  12/22/19   [provider]    Allergies    Patient has no known allergies.  Review of Systems   Review of Systems Constitutional: No fever no chills no malaise ENT: No nasal congestion sore throat or sinus pressure Physical Exam Updated Vital Signs BP (!) 146/81 (BP Location: Right Arm)    Pulse 85    Temp 99 F (37.2 C) (Oral)    Resp 18    SpO2 100%   Physical Exam Constitutional:      Appearance: Normal appearance.  HENT:     Head: Normocephalic and atraumatic.     Ears:     Comments: Left ear canal obscured by cerumen.  Right TM normal ear canals clear.    Mouth/Throat:     Mouth: Mucous membranes are moist.     Pharynx: Oropharynx is clear.  Eyes:     Extraocular Movements: Extraocular movements intact.  Pulmonary:     Effort: Pulmonary effort is normal.  Musculoskeletal:     Cervical back: Neck supple.  Skin:    General: Skin is warm and dry.  Neurological:     General: No focal deficit present.  Mental Status: She is alert and oriented to person, place, and time.     Coordination: Coordination normal.  Psychiatric:        Mood and Affect: Mood normal.    ED Results / Procedures / Treatments   Labs (all labs ordered are listed, but only abnormal results are displayed) Labs Reviewed - No data to display  EKG None  Radiology No results found.  Procedures .Ear Cerumen Removal  Date/Time: 03/22/2021 11:14 PM Performed by: Arby Barrette, MD Authorized by: Arby Barrette, MD   Consent:    Consent obtained:  Verbal   Consent given by:  Patient   Risks discussed:  Bleeding, infection, pain, TM perforation, incomplete removal and dizziness Procedure details:    Location:  L ear   Procedure type: irrigation     Procedure outcomes: cerumen removed   Post-procedure details:    Inspection:  Ear canal clear, TM intact and no bleeding   Hearing  quality:  Improved   Procedure completion:  Tolerated well, no immediate complications Comments:     Irrigation and curette used to remove cerumen.  Successfully removed all cerumen.   Medications Ordered in ED Medications - No data to display  ED Course  I have reviewed the triage vital signs and the nursing notes.  Pertinent labs & imaging results that were available during my care of the patient were reviewed by me and considered in my medical decision making (see chart for details).    MDM Rules/Calculators/A&P                           Patient presents with muffled hearing.  She has cerumen impaction on the left.  Successfully cleared with irrigation and curette.  Patient counseled on irrigating ear at home with shower stream. Final Clinical Impression(s) / ED Diagnoses Final diagnoses:  Impacted cerumen of left ear    Rx / DC Orders ED Discharge Orders     None        Arby Barrette, MD 03/22/21 2315

## 2021-03-22 NOTE — ED Provider Notes (Signed)
Emergency Medicine Provider Triage Evaluation Note  Rhonda Rios , a 50 y.o. female  was evaluated in triage.  Pt complains of left ear muffled hearing.  This is been going on for 2 weeks.  No pain.  No fever  Review of Systems  Positive: L ear muffled Negative: fever  Physical Exam  BP (!) 146/81 (BP Location: Right Arm)    Pulse 85    Temp 99 F (37.2 C) (Oral)    Resp 18    SpO2 100%  Gen:   Awake, no distress   Resp:  Normal effort  MSK:   Moves extremities without difficulty  Other:  Left ear with wax, right ear normal  Medical Decision Making  Medically screening exam initiated at 6:38 PM.  Appropriate orders placed.  Rhonda Rios was informed that the remainder of the evaluation will be completed by another provider, this initial triage assessment does not replace that evaluation, and the importance of remaining in the ED until their evaluation is complete.     Alveria Apley, PA-C 03/22/21 1838    Terald Sleeper, MD 03/23/21 0930

## 2021-09-27 ENCOUNTER — Ambulatory Visit (HOSPITAL_COMMUNITY)
Admission: EM | Admit: 2021-09-27 | Discharge: 2021-09-27 | Disposition: A | Discharge status: Home / Self Care | Payer: Medicare Other | Attending: Physician Assistant | Admitting: Physician Assistant

## 2021-09-27 ENCOUNTER — Encounter (HOSPITAL_COMMUNITY): Payer: Self-pay

## 2021-09-27 DIAGNOSIS — H6983 Other specified disorders of Eustachian tube, bilateral: Secondary | ICD-10-CM

## 2021-09-27 DIAGNOSIS — H9203 Otalgia, bilateral: Secondary | ICD-10-CM | POA: Diagnosis present

## 2021-09-27 LAB — POCT RAPID STREP A, ED / UC: Streptococcus, Group A Screen (Direct): NEGATIVE

## 2021-09-27 MED ORDER — LEVOCETIRIZINE DIHYDROCHLORIDE 5 MG PO TABS: 5.0000 mg | ORAL_TABLET | Freq: Every evening | ORAL | 0 refills | Status: AC

## 2021-09-27 MED ORDER — FLUTICASONE PROPIONATE 50 MCG/ACT NA SUSP: 1.0000 | Freq: Every day | NASAL | 0 refills | Status: AC

## 2021-09-27 NOTE — Discharge Instructions (Signed)
There is no evidence of infection.  I believe that you have eustachian tube dysfunction.  Please use allergy medication as prescribed.  I also recommend sinus rinses.  This can sometimes be difficult to treat so I recommend you follow-up with ENT if symptoms or not improving quickly.  Please call to schedule appointment.  If anything worsens and you have worsening pain, fever, nausea, vomiting, dizziness you need to be seen immediately.

## 2021-09-27 NOTE — ED Triage Notes (Signed)
Pt reports bilateral ear pain x 3-4 days.

## 2021-09-27 NOTE — ED Provider Notes (Signed)
[facility]    CSN: [id number] Arrival date & time: 09/27/21  1541      History   Chief Complaint Chief Complaint  Patient presents with   Ear Pain    HPI Vedanshi Shiri Hodapp is a 51 y.o. female.   Patient presents today with a 3 to 4-day history of bilateral ear pain.  Reports ear pain is rated 8 on a 0-10 pain scale, localized to bilateral ears but worse in the right, described as throbbing, no aggravating or alleviating factors identified.  Does report a mild cough and congestion but attributes this to allergies and asthma.  She has tried multiple over-the-counter medications for symptom relief.  She denies any recent airplane travel, barotrauma, swimming.  She denies any recent antibiotic use.  Denies any known sick contacts.  She is having difficulty with daily activities as a result of symptoms.    Past Medical History:  Diagnosis Date   Anxiety    Asthma    Hypertension     There are no problems to display for this patient.   History reviewed. No pertinent surgical history.  OB History   No obstetric history on file.      Home Medications    Prior to Admission medications   Medication Sig Start Date End Date Taking? Authorizing Provider  fluticasone (FLONASE) 50 MCG/ACT nasal spray Place 1 spray into both nostrils daily. 09/27/21  Yes Gianne Shugars, Noberto Retort, PA-C  levocetirizine (XYZAL ALLERGY 24HR) 5 MG tablet Take 1 tablet (5 mg total) by mouth every evening. 09/27/21  Yes Kaizen Ibsen K, PA-C  acetaminophen (TYLENOL) 325 MG tablet Take 650 mg by mouth every 6 (six) hours as needed for mild pain.     [provider]  albuterol (VENTOLIN HFA) 108 (90 Base) MCG/ACT inhaler Inhale 1-2 puffs into the lungs every 6 (six) hours as needed for wheezing or shortness of breath. 01/16/20   Mardella Layman, MD  cyclobenzaprine (FLEXERIL) 5 MG tablet Take 1 tablet (5 mg total) by mouth 3 (three) times daily as needed for muscle spasms. 02/24/19   Blane Ohara, MD   cyclobenzaprine (FLEXERIL) 5 MG tablet Take 1 tablet (5 mg total) by mouth 2 (two) times daily as needed for muscle spasms. 03/04/19   Mannie Stabile, PA-C  FLUoxetine (PROZAC) 20 MG capsule Take 60 mg by mouth every morning. 12/22/19   [provider]  ibuprofen (ADVIL,MOTRIN) 200 MG tablet Take 400 mg by mouth every 6 (six) hours as needed for moderate pain.     [provider]  prazosin (MINIPRESS) 1 MG capsule Take 1 mg by mouth at bedtime. 12/22/19   [provider]  QUEtiapine (SEROQUEL) 100 MG tablet Take by mouth. 12/22/19   [provider]  traZODone (DESYREL) 50 MG tablet  12/22/19   [provider]    Family History Family History  Problem Relation Age of Onset   Stroke Father    Hypertension Father     Social History Social History   Tobacco Use   Smoking status: Never   Smokeless tobacco: Never  Substance Use Topics   Alcohol use: No   Drug use: No     Allergies   Patient has no known allergies.   Review of Systems Review of Systems  Constitutional:  Positive for activity change. Negative for appetite change, fatigue and fever.  HENT:  Positive for ear pain and hearing loss. Negative for congestion, sinus pressure, sneezing and sore throat.  Respiratory:  Positive for cough. Negative for shortness of breath.   Cardiovascular:  Negative for chest pain.  Gastrointestinal:  Negative for abdominal pain, diarrhea, nausea and vomiting.  Neurological:  Negative for dizziness, light-headedness and headaches.     Physical Exam Triage Vital Signs ED Triage Vitals  Enc Vitals Group     BP 09/27/21 1608 (!) 160/104     Pulse Rate 09/27/21 1608 (!) 112     Resp 09/27/21 1608 16     Temp 09/27/21 1608 98.3 F (36.8 C)     Temp Source 09/27/21 1608 Oral     SpO2 09/27/21 1608 100 %     Weight --      Height --      Head Circumference --      Peak Flow --      Pain Score 09/27/21 1611 4     Pain Loc --      Pain  Edu? --      Excl. in GC? --    No data found.  Updated Vital Signs BP (!) 142/88   Pulse 85   Temp 98.3 F (36.8 C) (Oral)   Resp 16   SpO2 100%   Visual Acuity Right Eye Distance:   Left Eye Distance:   Bilateral Distance:    Right Eye Near:   Left Eye Near:    Bilateral Near:     Physical Exam Vitals reviewed.  Constitutional:      General: She is awake. She is not in acute distress.    Appearance: Normal appearance. She is well-developed. She is not ill-appearing.     Comments: Very pleasant female appears stated age in no acute distress sitting comfortably in exam room  HENT:     Head: Normocephalic and atraumatic.     Right Ear: Ear canal and external ear normal. A middle ear effusion is present. Tympanic membrane is bulging.     Left Ear: Ear canal and external ear normal. A middle ear effusion is present. Tympanic membrane is bulging.     Nose:     Right Sinus: No maxillary sinus tenderness or frontal sinus tenderness.     Left Sinus: No maxillary sinus tenderness or frontal sinus tenderness.     Mouth/Throat:     Pharynx: Uvula midline. Posterior oropharyngeal erythema present. No oropharyngeal exudate.  Cardiovascular:     Rate and Rhythm: Normal rate and regular rhythm.     Heart sounds: S1 normal and S2 normal. Murmur heard.  Pulmonary:     Effort: Pulmonary effort is normal.     Breath sounds: Normal breath sounds. No wheezing, rhonchi or rales.     Comments: Clear to auscultation bilaterally Psychiatric:        Behavior: Behavior is cooperative.      UC Treatments / Results  Labs (all labs ordered are listed, but only abnormal results are displayed) Labs Reviewed  CULTURE, GROUP A STREP [facility])  POCT RAPID STREP A, ED / UC    EKG   Radiology No results found.  Procedures Procedures (including critical care time)  Medications Ordered in UC Medications - No data to display  Initial Impression / Assessment and Plan / UC Course  I have  reviewed the triage vital signs and the nursing notes.  Pertinent labs & imaging results that were available during my care of the patient were reviewed by me and considered in my medical decision making (see chart for details).     Strep  testing was obtained given erythema of throat as a daughter can often be referred from the throat and was negative.  Throat culture is pending.  Will defer antibiotics until results are available.  No evidence of acute infection on physical exam that would warrant initiation of antibiotics.  Discussed likely eustachian tube dysfunction as etiology of symptoms.  She was started on Xyzal and Flonase.  Discussed that if symptoms or not improving she should follow-up with ENT and was given contact information for local provider with instruction to call to schedule an appointment.  Strict return precautions given to which patient expressed understanding.  Final Clinical Impressions(s) / UC Diagnoses   Final diagnoses:  Otalgia of both ears  Dysfunction of both eustachian tubes     Discharge Instructions      There is no evidence of infection.  I believe that you have eustachian tube dysfunction.  Please use allergy medication as prescribed.  I also recommend sinus rinses.  This can sometimes be difficult to treat so I recommend you follow-up with ENT if symptoms or not improving quickly.  Please call to schedule appointment.  If anything worsens and you have worsening pain, fever, nausea, vomiting, dizziness you need to be seen immediately.     ED Prescriptions     Medication Sig Dispense Auth. Provider   fluticasone (FLONASE) 50 MCG/ACT nasal spray Place 1 spray into both nostrils daily. 16 g Karlin Heilman K, PA-C   levocetirizine (XYZAL ALLERGY 24HR) 5 MG tablet Take 1 tablet (5 mg total) by mouth every evening. 30 tablet Shayde Gervacio, Noberto Retort, PA-C      PDMP not reviewed this encounter.   Jeani Hawking, PA-C 09/27/21 1737

## 2021-09-30 LAB — CULTURE, GROUP A STREP (THRC)

## 2022-12-31 ENCOUNTER — Encounter (HOSPITAL_COMMUNITY): Payer: Self-pay

## 2022-12-31 ENCOUNTER — Emergency Department (HOSPITAL_COMMUNITY)
Admission: EM | Admit: 2022-12-31 | Discharge: 2022-12-31 | Discharge status: Against Medical Advice | Payer: Medicare (Managed Care) | Attending: Emergency Medicine | Admitting: Emergency Medicine

## 2022-12-31 ENCOUNTER — Other Ambulatory Visit: Payer: Self-pay

## 2022-12-31 DIAGNOSIS — Z5329 Procedure and treatment not carried out because of patient's decision for other reasons: Secondary | ICD-10-CM | POA: Diagnosis not present

## 2022-12-31 DIAGNOSIS — K0889 Other specified disorders of teeth and supporting structures: Secondary | ICD-10-CM | POA: Insufficient documentation

## 2022-12-31 NOTE — ED Notes (Signed)
Pt eloped from ED lobby.

## 2022-12-31 NOTE — ED Triage Notes (Signed)
Pt came in via POV d/t lower Rt dental pain the last 2 days, reports fever yesterday but not today. Does hurt in her Rt jaw, no drainage around the tooth. A/Ox4, rates her pain 10/10 while in triage.

## 2023-07-25 ENCOUNTER — Other Ambulatory Visit: Payer: Self-pay | Admitting: Family

## 2023-07-25 DIAGNOSIS — Z1231 Encounter for screening mammogram for malignant neoplasm of breast: Secondary | ICD-10-CM

## 2023-08-01 NOTE — Progress Notes (Deleted)
 [facility] CONSULT NOTE  Patient Care Team: Medicine, [facility] as PCP - General  ASSESSMENT & PLAN:  53 y.o. female with history of Vitamin D deficiency being seen for iron deficiency anemia.  The mechanism of IDA is due to either blood loss or decreased absorptive mechanism or both. We discussed some of the risks, benefits, and alternatives of intravenous iron infusions. The patient is symptomatic from anemia and the iron level is critically low. She tolerated oral iron supplement poorly and desires to achieved higher levels of iron faster for adequate hematopoesis. Some of the side-effects to be expected including risks of infusion reactions, phlebitis, headaches, nausea and fatigue.  The patient is willing to proceed. Patient education material was dispensed.   Ordering iv iron to be given at [address]  Given she is 53 yo, recommend colonoscopy, and EGD if normal colon and GI evaluation.  IV iron *** ordered. CBC, Iron, TIBC, ferritin Referral for colonoscopy and EGD  Assessment & Plan   No orders of the defined types were placed in this encounter.   Relevant history: History of *** Last colonoscopy: Last EGD: Periods:    All questions were answered. The patient knows to call the clinic with any problems, questions or concerns.  Lowanda Ruddy, MD 4/24/20255:36 PM   CHIEF COMPLAINTS/PURPOSE OF CONSULTATION:  Anemia  HISTORY OF PRESENTING ILLNESS:  Rhonda Rios 53 y.o. female with Vitamin D deficiency is here because of anemia.  Outside labs from 07/25/23 showed ferritin of 4. Hgb 6.1. MCV 63.  Raymonda had not noticed any recent bleeding such as melena, hematuria or hematochezia Her last colonoscopy was *** ***She had no prior history or diagnosis of cancer. Her age appropriate screening programs are up-to-date. ***She denies any pica and eats a variety of diet. ***She denies blood donation or received blood  transfusion ***The patient was prescribed oral iron supplements and she takes ***  MEDICAL HISTORY:  Past Medical History:  Diagnosis Date   Anxiety    Asthma    Hypertension     SURGICAL HISTORY: No past surgical history on file.  SOCIAL HISTORY: Social History   Socioeconomic History   Marital status: Single    Spouse name: Not on file   Number of children: Not on file   Years of education: Not on file   Highest education level: Not on file  Occupational History   Not on file  Tobacco Use   Smoking status: Never   Smokeless tobacco: Never  Substance and Sexual Activity   Alcohol use: No   Drug use: No   Sexual activity: Yes    Birth control/protection: None  Other Topics Concern   Not on file  Social History Narrative   Not on file   Social Drivers of Health   Financial Resource Strain: Not on File (07/27/2021)   Received from [employer]    Financial Resource Strain: 0  Food Insecurity: Not at Risk (07/25/2023)   Received from [facility] Insecurity    Food: 1  Transportation Needs: Not at Risk (07/25/2023)   Received from [employer] Needs    Transportation: 1  Physical Activity: Not on File (07/27/2021)   Received from [facility]   Physical Activity    Physical Activity: 0  Stress: Not on File (07/27/2021)   Received from [facility]   Stress    Stress: 0  Social Connections: Not on File (12/22/2022)  Received from [employer]    Connectedness: 0  Intimate Partner Violence: Not on file    FAMILY HISTORY: Family History  Problem Relation Age of Onset   Stroke Father    Hypertension Father     ALLERGIES:  has no known allergies.  MEDICATIONS:  Current Outpatient Medications  Medication Sig Dispense Refill   acetaminophen  (TYLENOL ) 325 MG tablet Take 650 mg by mouth every 6 (six) hours as needed for mild pain.      albuterol  (VENTOLIN  HFA) 108 (90 Base) MCG/ACT inhaler Inhale 1-2 puffs into the lungs  every 6 (six) hours as needed for wheezing or shortness of breath. 1 each 2   cyclobenzaprine  (FLEXERIL ) 5 MG tablet Take 1 tablet (5 mg total) by mouth 3 (three) times daily as needed for muscle spasms. 10 tablet 0   cyclobenzaprine  (FLEXERIL ) 5 MG tablet Take 1 tablet (5 mg total) by mouth 2 (two) times daily as needed for muscle spasms. 12 tablet 0   FLUoxetine (PROZAC) 20 MG capsule Take 60 mg by mouth every morning.     fluticasone  (FLONASE ) 50 MCG/ACT nasal spray Place 1 spray into both nostrils daily. 16 g 0   ibuprofen  (ADVIL ,MOTRIN ) 200 MG tablet Take 400 mg by mouth every 6 (six) hours as needed for moderate pain.      levocetirizine (XYZAL  ALLERGY 24HR) 5 MG tablet Take 1 tablet (5 mg total) by mouth every evening. 30 tablet 0   prazosin (MINIPRESS) 1 MG capsule Take 1 mg by mouth at bedtime.     QUEtiapine (SEROQUEL) 100 MG tablet Take by mouth.     traZODone (DESYREL) 50 MG tablet      No current facility-administered medications for this visit.    REVIEW OF SYSTEMS:   All relevant systems were reviewed with the patient and are negative.  PHYSICAL EXAMINATION: ECOG PERFORMANCE STATUS: {CHL ONC ECOG [id number]}  There were no vitals filed for this visit. There were no vitals filed for this visit.  GENERAL: alert, no distress and comfortable SKIN: skin color normal EYES: normal conjunctiva, sclera clear LUNGS: normal breathing effort HEART: regular rate & rhythm ABDOMEN: abdomen soft, non-tender and nondistended  RADIOGRAPHIC STUDIES: I have personally reviewed the radiological images as listed and agreed with the findings in the report. No results found.

## 2023-08-02 ENCOUNTER — Telehealth: Payer: Self-pay | Admitting: Medical Oncology

## 2023-08-02 ENCOUNTER — Inpatient Hospital Stay: Payer: Medicare (Managed Care)

## 2023-08-02 ENCOUNTER — Telehealth: Payer: Self-pay

## 2023-08-02 NOTE — Telephone Encounter (Signed)
 Tried to contact pt about appt today . " Voice mail is not set up".

## 2023-08-02 NOTE — Telephone Encounter (Signed)
 I informed Rhonda Rios that we had to reschedule her appointment from 12 noon to 12:30. I advised Rhonda Rios to return my call if anything needs to be changed.

## 2024-02-24 ENCOUNTER — Ambulatory Visit (HOSPITAL_COMMUNITY)
Admission: EM | Admit: 2024-02-24 | Discharge: 2024-02-24 | Disposition: A | Discharge status: Home / Self Care | Payer: Medicare (Managed Care) | Attending: Emergency Medicine | Admitting: Emergency Medicine

## 2024-02-24 ENCOUNTER — Encounter (HOSPITAL_COMMUNITY): Payer: Self-pay | Admitting: Emergency Medicine

## 2024-02-24 DIAGNOSIS — M65311 Trigger thumb, right thumb: Secondary | ICD-10-CM

## 2024-02-24 MED ORDER — NAPROXEN 500 MG PO TABS: 500.0000 mg | ORAL_TABLET | Freq: Two times a day (BID) | ORAL | 0 refills | Status: AC

## 2024-02-24 NOTE — ED Provider Notes (Signed)
 [facility]    CSN: [id number] Arrival date & time: 02/24/24  1424      History   Chief Complaint Chief Complaint  Patient presents with   Hand Pain    HPI Rhonda Rios is a 53 y.o. female.   Patient presents to clinic over concern of stiffness and a locking sensation in the right thumb.  A few days ago her thumb popped and now she is having difficulty flexing the thumb.  Reports a few days ago it was warm and swollen, this seems to have improved.  Has not had any injury, trauma or falls.  History of carpal tunnel in this hand years ago.  The history is provided by the patient and medical records.  Hand Pain    Past Medical History:  Diagnosis Date   Anxiety    Asthma    Hypertension     There are no active problems to display for this patient.   History reviewed. No pertinent surgical history.  OB History   No obstetric history on file.      Home Medications    Prior to Admission medications   Medication Sig Start Date End Date Taking? Authorizing Provider  naproxen  (NAPROSYN ) 500 MG tablet Take 1 tablet (500 mg total) by mouth 2 (two) times daily. 02/24/24  Yes Grenda Lora  N, FNP  acetaminophen  (TYLENOL ) 325 MG tablet Take 650 mg by mouth every 6 (six) hours as needed for mild pain.     [provider]  albuterol  (VENTOLIN  HFA) 108 (90 Base) MCG/ACT inhaler Inhale 1-2 puffs into the lungs every 6 (six) hours as needed for wheezing or shortness of breath. 01/16/20   Rolinda Rogue, MD  cyclobenzaprine  (FLEXERIL ) 5 MG tablet Take 1 tablet (5 mg total) by mouth 3 (three) times daily as needed for muscle spasms. 02/24/19   Tonia Chew, MD  cyclobenzaprine  (FLEXERIL ) 5 MG tablet Take 1 tablet (5 mg total) by mouth 2 (two) times daily as needed for muscle spasms. 03/04/19   Aberman, Caroline C, PA-C  FLUoxetine (PROZAC) 20 MG capsule Take 60 mg by mouth every morning. 12/22/19   [provider]  fluticasone  (FLONASE ) 50  MCG/ACT nasal spray Place 1 spray into both nostrils daily. 09/27/21   Raspet, Erin K, PA-C  ibuprofen  (ADVIL ,MOTRIN ) 200 MG tablet Take 400 mg by mouth every 6 (six) hours as needed for moderate pain.     [provider]  levocetirizine (XYZAL  ALLERGY 24HR) 5 MG tablet Take 1 tablet (5 mg total) by mouth every evening. 09/27/21   Raspet, Erin K, PA-C  prazosin (MINIPRESS) 1 MG capsule Take 1 mg by mouth at bedtime. 12/22/19   [provider]  QUEtiapine (SEROQUEL) 100 MG tablet Take by mouth. 12/22/19   [provider]  traZODone (DESYREL) 50 MG tablet  12/22/19   [provider]    Family History Family History  Problem Relation Age of Onset   Stroke Father    Hypertension Father     Social History Social History   Tobacco Use   Smoking status: Never   Smokeless tobacco: Never  Substance Use Topics   Alcohol use: No   Drug use: No     Allergies   Patient has no known allergies.   Review of Systems Review of Systems  Per HPI  Physical Exam Triage Vital Signs ED Triage Vitals  Encounter Vitals Group     BP 02/24/24 1558 (!) 148/88     Girls  Systolic BP Percentile --      Girls Diastolic BP Percentile --      Boys Systolic BP Percentile --      Boys Diastolic BP Percentile --      Pulse Rate 02/24/24 1558 87     Resp 02/24/24 1558 18     Temp 02/24/24 1558 98.4 F (36.9 C)     Temp Source 02/24/24 1558 Oral     SpO2 02/24/24 1558 96 %     Weight --      Height --      Head Circumference --      Peak Flow --      Pain Score 02/24/24 1557 8     Pain Loc --      Pain Education --      Exclude from Growth Chart --    No data found.  Updated Vital Signs BP (!) 148/88 (BP Location: Left Arm)   Pulse 87   Temp 98.4 F (36.9 C) (Oral)   Resp 18   LMP 01/25/2024 (Approximate)   SpO2 96%   Visual Acuity Right Eye Distance:   Left Eye Distance:   Bilateral Distance:    Right Eye Near:   Left Eye Near:    Bilateral  Near:     Physical Exam Vitals and nursing note reviewed.  Constitutional:      Appearance: Normal appearance.  HENT:     Head: Normocephalic and atraumatic.     Right Ear: External ear normal.     Left Ear: External ear normal.     Nose: Nose normal.     Mouth/Throat:     Mouth: Mucous membranes are moist.  Eyes:     Conjunctiva/sclera: Conjunctivae normal.  Cardiovascular:     Rate and Rhythm: Normal rate.     Pulses: Normal pulses.  Musculoskeletal:        General: Tenderness present. No signs of injury.     Right hand: Tenderness present.     Comments: Tenderness at the base of the thumb with thumb fixed and extension, pain with passive and active range of motion suspicious for trigger finger  Skin:    General: Skin is warm and dry.  Neurological:     General: No focal deficit present.     Mental Status: She is alert and oriented to person, place, and time.  Psychiatric:        Mood and Affect: Mood normal.        Behavior: Behavior normal. Behavior is cooperative.      UC Treatments / Results  Labs (all labs ordered are listed, but only abnormal results are displayed) Labs Reviewed - No data to display  EKG   Radiology No results found.  Procedures Procedures (including critical care time)  Medications Ordered in UC Medications - No data to display  Initial Impression / Assessment and Plan / UC Course  I have reviewed the triage vital signs and the nursing notes.  Pertinent labs & imaging results that were available during my care of the patient were reviewed by me and considered in my medical decision making (see chart for details).  Vitals and triage reviewed, patient is hemodynamically stable.  History and physical exam consistent with trigger thumb.  Atraumatic, imaging deferred.  Brisk capillary refill distally.  Limited range of motion due to pain.  Lower concern for gout with the popping and locking sensation.  Offered IM steroid in clinic,  patient declined.  Opted to  move forward with oral NSAIDs and bracing.  Encouraged orthopedic follow-up for further advanced evaluation.     Final Clinical Impressions(s) / UC Diagnoses   Final diagnoses:  Trigger finger of right thumb     Discharge Instructions      Your physical exam was consistent with trigger finger.  This is caused by inflammation of the connective tissue and the ligaments.  Take the anti-inflammatory medicine with food twice daily.  Use the thumb spica to help provide comfort.  Follow-up with sports medicine for further evaluation where they can consider joint injections and physical therapy.  Return to clinic for any new or urgent symptoms.    ED Prescriptions     Medication Sig Dispense Auth. Provider   naproxen  (NAPROSYN ) 500 MG tablet Take 1 tablet (500 mg total) by mouth 2 (two) times daily. 30 tablet Dreama, Novalynn Branaman  N, FNP      PDMP not reviewed this encounter.   Dreama, Cinzia Devos  N, FNP 02/24/24 [phone]

## 2024-02-24 NOTE — Discharge Instructions (Addendum)
 Your physical exam was consistent with trigger finger.  This is caused by inflammation of the connective tissue and the ligaments.  Take the anti-inflammatory medicine with food twice daily.  Use the thumb spica to help provide comfort.  Follow-up with sports medicine for further evaluation where they can consider joint injections and physical therapy.  Return to clinic for any new or urgent symptoms.

## 2024-02-24 NOTE — ED Triage Notes (Signed)
 Pt reports that couple days ago right thumb popped and hurts to move and unable to bend it. Denies any injury.
